# Patient Record
Sex: Female | Born: 1990 | Race: Black or African American | Hispanic: No | Marital: Single | Smoking: Never smoker
Health system: Southern US, Community
[De-identification: ages and names within clinical notes are randomized; demographics above are authoritative.]

## PROBLEM LIST (undated history)

## (undated) DIAGNOSIS — M545 Low back pain: Secondary | ICD-10-CM

## (undated) DIAGNOSIS — Z789 Other specified health status: Secondary | ICD-10-CM

## (undated) DIAGNOSIS — G8929 Other chronic pain: Secondary | ICD-10-CM

## (undated) HISTORY — PX: OTHER SURGICAL HISTORY: SHX169

## (undated) HISTORY — PX: TOOTH EXTRACTION: SUR596

## (undated) HISTORY — DX: Other chronic pain: G89.29

## (undated) HISTORY — DX: Low back pain: M54.5

---
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
## 2010-01-15 ENCOUNTER — Emergency Department: Payer: Self-pay | Admitting: Emergency Medicine

## 2010-09-04 ENCOUNTER — Observation Stay: Payer: Self-pay

## 2010-09-06 ENCOUNTER — Inpatient Hospital Stay: Payer: Self-pay

## 2011-05-06 ENCOUNTER — Emergency Department: Payer: Self-pay | Admitting: Emergency Medicine

## 2011-05-07 LAB — URINALYSIS, COMPLETE
Bacteria: NONE SEEN
Bilirubin,UR: NEGATIVE
Blood: NEGATIVE
Glucose,UR: NEGATIVE mg/dL (ref 0–75)
Nitrite: NEGATIVE
Ph: 5 (ref 4.5–8.0)
Protein: NEGATIVE
RBC,UR: 2 /HPF (ref 0–5)
Specific Gravity: 1.019 (ref 1.003–1.030)
Squamous Epithelial: 1
WBC UR: 5 /HPF (ref 0–5)

## 2011-05-07 LAB — CBC
HCT: 37.9 % (ref 35.0–47.0)
HGB: 12.8 g/dL (ref 12.0–16.0)
MCH: 31.9 pg (ref 26.0–34.0)
MCHC: 33.8 g/dL (ref 32.0–36.0)
MCV: 94 fL (ref 80–100)
Platelet: 204 10*3/uL (ref 150–440)
RBC: 4.02 10*6/uL (ref 3.80–5.20)
RDW: 12.4 % (ref 11.5–14.5)
WBC: 5.1 10*3/uL (ref 3.6–11.0)

## 2011-05-07 LAB — COMPREHENSIVE METABOLIC PANEL
Albumin: 3.9 g/dL (ref 3.4–5.0)
Alkaline Phosphatase: 54 U/L (ref 50–136)
Anion Gap: 16 (ref 7–16)
BUN: 9 mg/dL (ref 7–18)
Bilirubin,Total: 0.5 mg/dL (ref 0.2–1.0)
Calcium, Total: 8.3 mg/dL — ABNORMAL LOW (ref 8.5–10.1)
Chloride: 106 mmol/L (ref 98–107)
Co2: 20 mmol/L — ABNORMAL LOW (ref 21–32)
Creatinine: 0.73 mg/dL (ref 0.60–1.30)
EGFR (African American): >60
EGFR (Non-African Amer.): >60
Glucose: 90 mg/dL (ref 65–99)
Osmolality: 281 (ref 275–301)
Potassium: 3.5 mmol/L (ref 3.5–5.1)
SGOT(AST): 24 U/L (ref 15–37)
SGPT (ALT): 23 U/L
Sodium: 142 mmol/L (ref 136–145)
Total Protein: 7.4 g/dL (ref 6.4–8.2)

## 2011-05-07 LAB — PREGNANCY, URINE: Pregnancy Test, Urine: NEGATIVE m[IU]/mL

## 2012-05-31 ENCOUNTER — Emergency Department: Payer: Self-pay | Admitting: Emergency Medicine

## 2012-05-31 LAB — URINALYSIS, COMPLETE
Bilirubin,UR: NEGATIVE
Blood: NEGATIVE
Glucose,UR: NEGATIVE mg/dL (ref 0–75)
Hyaline Cast: 1
Ketone: NEGATIVE
Nitrite: NEGATIVE
Ph: 7 (ref 4.5–8.0)
Protein: 30
RBC,UR: 7 /HPF (ref 0–5)
Specific Gravity: 1.025 (ref 1.003–1.030)
Squamous Epithelial: 9
WBC UR: 12 /HPF (ref 0–5)

## 2012-05-31 LAB — COMPREHENSIVE METABOLIC PANEL
Albumin: 4.2 g/dL (ref 3.4–5.0)
Alkaline Phosphatase: 66 U/L (ref 50–136)
Anion Gap: 5 — ABNORMAL LOW (ref 7–16)
BUN: 11 mg/dL (ref 7–18)
Bilirubin,Total: 0.7 mg/dL (ref 0.2–1.0)
Calcium, Total: 8.5 mg/dL (ref 8.5–10.1)
Chloride: 106 mmol/L (ref 98–107)
Co2: 27 mmol/L (ref 21–32)
Creatinine: 0.76 mg/dL (ref 0.60–1.30)
EGFR (African American): >60
EGFR (Non-African Amer.): >60
Glucose: 98 mg/dL (ref 65–99)
Osmolality: 275 (ref 275–301)
Potassium: 3.6 mmol/L (ref 3.5–5.1)
SGOT(AST): 21 U/L (ref 15–37)
SGPT (ALT): 17 U/L (ref 12–78)
Sodium: 138 mmol/L (ref 136–145)
Total Protein: 7.6 g/dL (ref 6.4–8.2)

## 2012-05-31 LAB — CBC
HCT: 39.1 % (ref 35.0–47.0)
HGB: 13.6 g/dL (ref 12.0–16.0)
MCH: 32.4 pg (ref 26.0–34.0)
MCHC: 34.8 g/dL (ref 32.0–36.0)
MCV: 93 fL (ref 80–100)
Platelet: 208 10*3/uL (ref 150–440)
RBC: 4.19 10*6/uL (ref 3.80–5.20)
RDW: 12.6 % (ref 11.5–14.5)
WBC: 5.9 10*3/uL (ref 3.6–11.0)

## 2012-05-31 LAB — LIPASE, BLOOD: Lipase: 84 U/L (ref 73–393)

## 2013-03-09 ENCOUNTER — Emergency Department: Payer: Self-pay | Admitting: Emergency Medicine

## 2013-03-09 LAB — WET PREP, GENITAL

## 2013-03-09 LAB — URINALYSIS, COMPLETE
Bacteria: NONE SEEN
Bilirubin,UR: NEGATIVE
Blood: NEGATIVE
Glucose,UR: NEGATIVE mg/dL (ref 0–75)
Hyaline Cast: 1
Ketone: NEGATIVE
Leukocyte Esterase: NEGATIVE
Nitrite: NEGATIVE
Ph: 7 (ref 4.5–8.0)
Protein: NEGATIVE
RBC,UR: 5 /HPF (ref 0–5)
Specific Gravity: 1.026 (ref 1.003–1.030)
Squamous Epithelial: 3
WBC UR: 4 /HPF (ref 0–5)

## 2013-03-09 LAB — PREGNANCY, URINE: Pregnancy Test, Urine: NEGATIVE m[IU]/mL

## 2013-03-10 LAB — GC/CHLAMYDIA PROBE AMP

## 2013-11-17 ENCOUNTER — Emergency Department: Payer: Self-pay | Admitting: Student

## 2014-01-12 ENCOUNTER — Emergency Department: Payer: Self-pay | Admitting: Emergency Medicine

## 2014-01-19 ENCOUNTER — Emergency Department: Payer: Self-pay | Admitting: Emergency Medicine

## 2014-03-03 NOTE — L&D Delivery Note (Signed)
Delivery Note At 5:53 PM a viable female was delivered via Vaginal, Spontaneous Delivery (Presentation: ROA).  APGAR: 7, 9; weight TBD .   Placenta status: Intact, Spontaneous.  Cord: 3 vessels  with the following complications: tight nuchal cord x1, not able to be reduced at the perineum.  Cord pH: 7.18  Anesthesia: epidural  Episiotomy:  none Lacerations:  none Suture Repair: n/a Est. Blood Loss (mL):  300cc  Mom to postpartum.  Baby to Couplet care / Skin to Skin.  Baby was having deep variables and IUPC with amnioinfusion was placed.  Shortly after the bolus the patient was complete.  We began pushing, and after position change to patient's right side, baby was delivered.  There was a tight nuchal and baby was limp upon delivery.  Cord was cut and baby handed to nursing.  A section of cord was cut for cord gas, and blood was collected for Rh typing.  Placenta was quickly delivered spontaneously.  No lacerations.  Achille Xiang C 09/21/2014, 7:57 PM

## 2014-04-25 ENCOUNTER — Emergency Department: Payer: Self-pay | Admitting: Emergency Medicine

## 2014-09-07 LAB — OB RESULTS CONSOLE GBS: GBS: POSITIVE

## 2014-09-16 ENCOUNTER — Inpatient Hospital Stay: Admission: AD | Admit: 2014-09-16 | Payer: Self-pay | Source: Ambulatory Visit

## 2014-09-16 ENCOUNTER — Inpatient Hospital Stay: Admit: 2014-09-16 | Payer: Self-pay

## 2014-09-21 ENCOUNTER — Inpatient Hospital Stay: Payer: Medicaid Other | Admitting: Certified Registered"

## 2014-09-21 ENCOUNTER — Inpatient Hospital Stay
Admission: EM | Admit: 2014-09-21 | Discharge: 2014-09-23 | DRG: 775 | Disposition: A | Discharge status: Home / Self Care | Payer: Medicaid Other | Attending: Obstetrics & Gynecology | Admitting: Obstetrics & Gynecology

## 2014-09-21 DIAGNOSIS — Z30014 Encounter for initial prescription of intrauterine contraceptive device: Secondary | ICD-10-CM | POA: Diagnosis not present

## 2014-09-21 DIAGNOSIS — O99013 Anemia complicating pregnancy, third trimester: Secondary | ICD-10-CM | POA: Diagnosis present

## 2014-09-21 DIAGNOSIS — K219 Gastro-esophageal reflux disease without esophagitis: Secondary | ICD-10-CM | POA: Diagnosis present

## 2014-09-21 DIAGNOSIS — O480 Post-term pregnancy: Principal | ICD-10-CM | POA: Diagnosis present

## 2014-09-21 DIAGNOSIS — O3483 Maternal care for other abnormalities of pelvic organs, third trimester: Secondary | ICD-10-CM | POA: Diagnosis present

## 2014-09-21 DIAGNOSIS — O99824 Streptococcus B carrier state complicating childbirth: Secondary | ICD-10-CM | POA: Diagnosis present

## 2014-09-21 DIAGNOSIS — D62 Acute posthemorrhagic anemia: Secondary | ICD-10-CM | POA: Diagnosis not present

## 2014-09-21 DIAGNOSIS — R109 Unspecified abdominal pain: Secondary | ICD-10-CM | POA: Diagnosis present

## 2014-09-21 DIAGNOSIS — Z3A40 40 weeks gestation of pregnancy: Secondary | ICD-10-CM | POA: Diagnosis present

## 2014-09-21 DIAGNOSIS — O99213 Obesity complicating pregnancy, third trimester: Secondary | ICD-10-CM | POA: Diagnosis present

## 2014-09-21 DIAGNOSIS — N8320 Unspecified ovarian cysts: Secondary | ICD-10-CM | POA: Diagnosis present

## 2014-09-21 DIAGNOSIS — O09293 Supervision of pregnancy with other poor reproductive or obstetric history, third trimester: Secondary | ICD-10-CM | POA: Diagnosis not present

## 2014-09-21 HISTORY — DX: Other specified health status: Z78.9

## 2014-09-21 LAB — CBC
HCT: 33.1 % — ABNORMAL LOW (ref 35.0–47.0)
Hemoglobin: 11.1 g/dL — ABNORMAL LOW (ref 12.0–16.0)
MCH: 30.4 pg (ref 26.0–34.0)
MCHC: 33.6 g/dL (ref 32.0–36.0)
MCV: 90.6 fL (ref 80.0–100.0)
Platelets: 171 10*3/uL (ref 150–440)
RBC: 3.66 MIL/uL — ABNORMAL LOW (ref 3.80–5.20)
RDW: 14.1 % (ref 11.5–14.5)
WBC: 8.4 10*3/uL (ref 3.6–11.0)

## 2014-09-21 LAB — TYPE AND SCREEN
ABO/RH(D): O POS
Antibody Screen: NEGATIVE

## 2014-09-21 LAB — OB RESULTS CONSOLE VARICELLA ZOSTER ANTIBODY, IGG: Varicella: IMMUNE

## 2014-09-21 LAB — OB RESULTS CONSOLE RUBELLA ANTIBODY, IGM: Rubella: IMMUNE

## 2014-09-21 LAB — ABO/RH: ABO/RH(D): O POS

## 2014-09-21 MED ORDER — IBUPROFEN 600 MG PO TABS
ORAL_TABLET | ORAL | Status: AC
  Administered 2014-09-21: 600 mg via ORAL
  Filled 2014-09-21: qty 1

## 2014-09-21 MED ORDER — SODIUM CHLORIDE 0.9 % IV SOLN
2.0000 g | Freq: Once | INTRAVENOUS | Status: AC
  Administered 2014-09-21: 2 g via INTRAVENOUS
  Filled 2014-09-21: qty 2000

## 2014-09-21 MED ORDER — SODIUM CHLORIDE 0.9 % IJ SOLN
3.0000 mL | INTRAMUSCULAR | Status: DC | PRN
Start: 2014-09-21 — End: 2014-09-21

## 2014-09-21 MED ORDER — NALOXONE HCL 1 MG/ML IJ SOLN: 1.0000 ug/kg/h | INTRAVENOUS | Status: DC | PRN

## 2014-09-21 MED ORDER — MISOPROSTOL 200 MCG PO TABS
800.0000 ug | ORAL_TABLET | Freq: Once | ORAL | Status: DC | PRN
  Filled 2014-09-21: qty 4

## 2014-09-21 MED ORDER — MISOPROSTOL 200 MCG PO TABS
ORAL_TABLET | ORAL | Status: AC
  Filled 2014-09-21: qty 1

## 2014-09-21 MED ORDER — BUPIVACAINE HCL (PF) 0.25 % IJ SOLN
INTRAMUSCULAR | Status: DC | PRN
  Administered 2014-09-21: 5 mL

## 2014-09-21 MED ORDER — ONDANSETRON HCL 4 MG PO TABS: 4.0000 mg | ORAL_TABLET | ORAL | Status: DC | PRN

## 2014-09-21 MED ORDER — LACTATED RINGERS IV SOLN: 500.0000 mL | INTRAVENOUS | Status: DC | PRN

## 2014-09-21 MED ORDER — IBUPROFEN 600 MG PO TABS
600.0000 mg | ORAL_TABLET | Freq: Four times a day (QID) | ORAL | Status: DC
  Administered 2014-09-21 – 2014-09-23 (×6): 600 mg via ORAL
  Filled 2014-09-21 (×5): qty 1

## 2014-09-21 MED ORDER — ONDANSETRON HCL 4 MG/2ML IJ SOLN: 4.0000 mg | Freq: Four times a day (QID) | INTRAMUSCULAR | Status: DC | PRN

## 2014-09-21 MED ORDER — NALBUPHINE HCL 10 MG/ML IJ SOLN
5.0000 mg | Freq: Once | INTRAMUSCULAR | Status: DC | PRN
  Filled 2014-09-21: qty 0.5

## 2014-09-21 MED ORDER — FENTANYL 2.5 MCG/ML W/ROPIVACAINE 0.2% IN NS 100 ML EPIDURAL INFUSION (ARMC-ANES)
EPIDURAL | Status: AC
  Administered 2014-09-21: 9 mL/h via EPIDURAL
  Filled 2014-09-21: qty 100

## 2014-09-21 MED ORDER — DIPHENHYDRAMINE HCL 50 MG/ML IJ SOLN: 12.5000 mg | INTRAMUSCULAR | Status: DC | PRN

## 2014-09-21 MED ORDER — OXYTOCIN BOLUS FROM INFUSION
500.0000 mL | INTRAVENOUS | Status: DC
Start: 2014-09-21 — End: 2014-09-21

## 2014-09-21 MED ORDER — SCOPOLAMINE 1 MG/3DAYS TD PT72
1.0000 | MEDICATED_PATCH | Freq: Once | TRANSDERMAL | Status: DC
  Filled 2014-09-21: qty 1

## 2014-09-21 MED ORDER — SODIUM CHLORIDE 0.9 % IV SOLN
1.0000 g | INTRAVENOUS | Status: DC
  Administered 2014-09-21 (×2): 1 g via INTRAVENOUS
  Filled 2014-09-21 (×6): qty 1000

## 2014-09-21 MED ORDER — ONDANSETRON HCL 4 MG/2ML IJ SOLN: 4.0000 mg | INTRAMUSCULAR | Status: DC | PRN

## 2014-09-21 MED ORDER — NALBUPHINE HCL 10 MG/ML IJ SOLN
5.0000 mg | INTRAMUSCULAR | Status: DC | PRN
  Filled 2014-09-21: qty 0.5

## 2014-09-21 MED ORDER — BENZOCAINE-MENTHOL 20-0.5 % EX AERO: 1.0000 "application " | INHALATION_SPRAY | CUTANEOUS | Status: DC | PRN

## 2014-09-21 MED ORDER — LANOLIN HYDROUS EX OINT: TOPICAL_OINTMENT | CUTANEOUS | Status: DC | PRN

## 2014-09-21 MED ORDER — ONDANSETRON HCL 4 MG/2ML IJ SOLN
4.0000 mg | Freq: Three times a day (TID) | INTRAMUSCULAR | Status: DC | PRN
Start: 2014-09-21 — End: 2014-09-21

## 2014-09-21 MED ORDER — DIPHENHYDRAMINE HCL 25 MG PO CAPS: 25.0000 mg | ORAL_CAPSULE | Freq: Four times a day (QID) | ORAL | Status: DC | PRN

## 2014-09-21 MED ORDER — NALOXONE HCL 0.4 MG/ML IJ SOLN: 0.4000 mg | INTRAMUSCULAR | Status: DC | PRN

## 2014-09-21 MED ORDER — LIDOCAINE-EPINEPHRINE (PF) 1.5 %-1:200000 IJ SOLN
INTRAMUSCULAR | Status: DC | PRN
  Administered 2014-09-21: 3 mL via EPIDURAL

## 2014-09-21 MED ORDER — AMMONIA AROMATIC IN INHA
0.3000 mL | Freq: Once | RESPIRATORY_TRACT | Status: AC | PRN
  Filled 2014-09-21: qty 10

## 2014-09-21 MED ORDER — WITCH HAZEL-GLYCERIN EX PADS: 1.0000 "application " | MEDICATED_PAD | CUTANEOUS | Status: DC | PRN

## 2014-09-21 MED ORDER — SIMETHICONE 80 MG PO CHEW: 80.0000 mg | CHEWABLE_TABLET | ORAL | Status: DC | PRN

## 2014-09-21 MED ORDER — DOCUSATE SODIUM 100 MG PO CAPS
100.0000 mg | ORAL_CAPSULE | Freq: Two times a day (BID) | ORAL | Status: DC
  Administered 2014-09-22 – 2014-09-23 (×3): 100 mg via ORAL
  Filled 2014-09-21 (×3): qty 1

## 2014-09-21 MED ORDER — LACTATED RINGERS IV SOLN
INTRAVENOUS | Status: DC
  Administered 2014-09-21: 999 mL/h via INTRAUTERINE

## 2014-09-21 MED ORDER — DIPHENHYDRAMINE HCL 25 MG PO CAPS
25.0000 mg | ORAL_CAPSULE | ORAL | Status: DC | PRN
Start: 2014-09-21 — End: 2014-09-21

## 2014-09-21 MED ORDER — PRENATAL MULTIVITAMIN CH
1.0000 | ORAL_TABLET | Freq: Every day | ORAL | Status: DC
  Administered 2014-09-22 – 2014-09-23 (×2): 1 via ORAL
  Filled 2014-09-21 (×2): qty 1

## 2014-09-21 MED ORDER — OXYTOCIN 40 UNITS IN LACTATED RINGERS INFUSION - SIMPLE MED
62.5000 mL/h | INTRAVENOUS | Status: DC
  Administered 2014-09-21: 3 mL/h via INTRAVENOUS
  Filled 2014-09-21: qty 1000

## 2014-09-21 MED ORDER — LACTATED RINGERS IV SOLN
INTRAVENOUS | Status: DC
  Administered 2014-09-21: 125 mL/h via INTRAVENOUS
  Administered 2014-09-21 (×2): via INTRAVENOUS

## 2014-09-21 MED ORDER — LIDOCAINE HCL (PF) 1 % IJ SOLN
30.0000 mL | INTRAMUSCULAR | Status: DC | PRN
  Filled 2014-09-21: qty 30

## 2014-09-21 MED ORDER — DIBUCAINE 1 % RE OINT: 1.0000 "application " | TOPICAL_OINTMENT | RECTAL | Status: DC | PRN

## 2014-09-21 MED ORDER — FENTANYL 2.5 MCG/ML W/ROPIVACAINE 0.2% IN NS 100 ML EPIDURAL INFUSION (ARMC-ANES): 9.0000 mL/h | EPIDURAL | Status: DC

## 2014-09-21 MED ORDER — ACETAMINOPHEN 325 MG PO TABS
650.0000 mg | ORAL_TABLET | ORAL | Status: DC | PRN
  Administered 2014-09-22 (×2): 650 mg via ORAL
  Filled 2014-09-21 (×2): qty 2

## 2014-09-21 NOTE — Anesthesia Preprocedure Evaluation (Addendum)
Anesthesia Evaluation  Patient identified by MRN, date of birth, ID band Patient awake    Reviewed: Allergy & Precautions, H&P , NPO status , Patient's Chart, lab work & pertinent test results  History of Anesthesia Complications Negative for: history of anesthetic complications  Airway Mallampati: I  TM Distance: >3 FB Neck ROM: full    Dental no notable dental hx. (+) Teeth Intact   Pulmonary neg pulmonary ROS,    Pulmonary exam normal       Cardiovascular negative cardio ROS Normal cardiovascular exam    Neuro/Psych negative neurological ROS  negative psych ROS   GI/Hepatic Neg liver ROS, GERD-  ,  Endo/Other  negative endocrine ROS  Renal/GU negative Renal ROS  negative genitourinary   Musculoskeletal   Abdominal   Peds  Hematology negative hematology ROS (+)   Anesthesia Other Findings Tongue piercing  Reproductive/Obstetrics (+) Pregnancy                             Anesthesia Physical Anesthesia Plan  ASA: II  Anesthesia Plan: Epidural   Post-op Pain Management:    Induction:   Airway Management Planned:   Additional Equipment:   Intra-op Plan:   Post-operative Plan:   Informed Consent: I have reviewed the patients History and Physical, chart, labs and discussed the procedure including the risks, benefits and alternatives for the proposed anesthesia with the patient or authorized representative who has indicated his/her understanding and acceptance.     Plan Discussed with: CRNA  Anesthesia Plan Comments:         Anesthesia Quick Evaluation

## 2014-09-21 NOTE — OB Triage Note (Signed)
Patient c/o contractions since 0330 this am 5-10 mins apart

## 2014-09-21 NOTE — Progress Notes (Signed)
In to see pt d/t prolonged FHR decel down to 60s, BP during episode down to 80s/40s. FHR decel resolved with O2, IV fluid bolus and repositioning.  Cervix 6/100/-2, BBOW Will plan to recheck shortly and consider AROM.

## 2014-09-21 NOTE — H&P (Signed)
H&P Note   Date of Initial H&P:18 September 2014 History reviewed, patient examined, no changes except as listed below.  24 year old G2 P1001 with EDC=09/17/2014 by LMP=8 week ultrasound presents at 40 4/7 weeks to L&D in labor. She reports onset of contractions at 0350 this AM.She was scheduled for an IOL 09/24/2014.  Pregnancy c/b BMI 35, 5 cm cyst, GBS positive.  EXAM: BP 116/55 mmHg  Pulse 65  Temp(Src) 98.3 F (36.8 C) (Oral)  Resp 20  LMP 12/11/2013 (Exact Date)  FHR: shortly after arrival to unit there were three late deep decelerations to the 60s , followed by a run of subtle late decelerations. Currently on her side FHR baseline 120s to 130 with accelerations and moderate variability.  Contractions q2-3 minutes apart Cervix on arrival 2-3/80%, now 3-4/50%/-2/vtx Ultrasound: vertex. AFI 9.7 cm  LABS: OPOS/ RI/VI/  RPR neg/HIV negative/ Hept B negative/ TDAP UTD/ Pap ascus with pos HPV  A/P IUP at 40.4 weeks in early labor Late decelerations on arrival-now resolved-monitor FHR closely GBS positive: PPX started RO cyst: can follow up pp  Farrel Conners, CNM

## 2014-09-21 NOTE — Discharge Summary (Signed)
Obstetrical Discharge Summary  Date of Admission: 09/21/2014 Date of Discharge: 09/21/2014  Primary OB: [facility]  Gestational Age at Delivery: [redacted]w[redacted]d   Antepartum complications: obesity, ovarian cyst, anemia, GBS+ Reason for Admission: labor Date of Delivery:  09/21/14 Delivered By: Leeroy Bock Ward Delivery Type: spontaneous vaginal delivery Intrapartum complications/course: Patient was admitted in labor and fetal strip showed recurrent variable decelerations. IUPC was inserted and amnioinfusion started but patient quickly progressed from 6 to 10 and due to fetal strip we began pushing.  Fetal head was high above the pubic symphysis, with deeper and longer variables to the 60s.  With change of position and patient pushing on her side, the fetal head rotated and then advanced well with great maternal effort.  Baby was delivered with a tight nuchal cord that was unable to be reduced at the perineum.  Baby was not vigorous immediately, so was handed off to the pediatric nursing staff after cord was cut.  Cord gas was collected and 7.18, and apgars were 7 and 9.  Placenta was delivered spontaneously and perineum was intact.   Anesthesia: epidural Placenta: sponatneous Laceration: none Episiotomy: none Newborn Data: Live born female  Birth Weight:   APGAR: 7, 9    Discharge Physical Exam:  General: NAD CV: RRR Pulm: CTABL, nl effort ABD: s/nd/nt, fundus firm and below the umbilicus Lochia: moderate DVT Evaluation: LE non-ttp, no evidence of DVT on exam.  HEMOGLOBIN  Date Value Ref Range Status  09/21/2014 11.1* 12.0 - 16.0 g/dL Final   HGB  Date Value Ref Range Status  05/31/2012 13.6 12.0-16.0 g/dL Final   HCT  Date Value Ref Range Status  09/21/2014 33.1* 35.0 - 47.0 % Final  05/31/2012 39.1 35.0-47.0 % Final    Post partum course: none Postpartum Procedures: none Disposition: stable, discharge to home.  Rh Immune globulin given: no Rubella vaccine given: no Tdap vaccine  given in AP or PP setting: yes, antepartum Flu vaccine given in AP or PP setting: no  Contraception: Mirena  Prenatal Labs:  OPOS/ RI/VI/ RPR neg/HIV negative/ Hept B negative/ TDAP UTD/ Pap ascus with pos HPV   Plan:  [place] D Menta was discharged to home in good condition. Follow-up appointment at [facility] with Dr Elesa Massed in 6 weeks   Discharge Medications:   Medication List    ASK your doctor about these medications        prenatal multivitamin Tabs tablet  Take 1 tablet by mouth daily at 12 noon.        Signed: Vena Austria, MD

## 2014-09-21 NOTE — Anesthesia Procedure Notes (Signed)
Epidural Patient location during procedure: OB Start time: 09/21/2014 11:50 AM End time: 09/21/2014 12:02 PM  Staffing Anesthesiologist: Rosaria Ferries Resident/CRNA: Mathews Argyle Performed by: resident/CRNA   Preanesthetic Checklist Completed: patient identified, site marked, surgical consent, pre-op evaluation, timeout performed, IV checked, risks and benefits discussed and monitors and equipment checked  Epidural Patient position: sitting Prep: Betadine and site prepped and draped Patient monitoring: heart rate, continuous pulse ox and blood pressure Approach: midline Location: L3-L4 Injection technique: LOR saline  Needle:  Needle type: Tuohy  Needle gauge: 18 G Needle length: 9 cm Needle insertion depth: 8 cm Catheter type: closed end flexible Catheter size: 20 Guage Catheter at skin depth: 13 cm Test dose: negative and 1.5% lidocaine with Epi 1:200 K  Assessment Events: blood not aspirated, injection not painful, no injection resistance, negative IV test and no paresthesia  Additional Notes   Patient tolerated the insertion well without complications.Reason for block:procedure for pain

## 2014-09-21 NOTE — Progress Notes (Signed)
L&D Note  09/21/2014 - 10:51 AM  24 y.o. G2P1001 [redacted]w[redacted]d   Ms. Mariette D Carrell is admitted for labor and FHR decelerations   Subjective:  Questionable leaking fluid  Objective:   Filed Vitals:   09/21/14 0550 09/21/14 0801 09/21/14 1029  BP: 128/63 116/55   Pulse: 67 65   Temp:  98.3 F (36.8 C) 98.5 F (36.9 C)  TempSrc:  Oral Oral  Resp:  20     Current Vital Signs 24h Vital Sign Ranges  T 98.5 F (36.9 C) Temp  Avg: 98.4 F (36.9 C)  Min: 98.3 F (36.8 C)  Max: 98.5 F (36.9 C)  BP (!) 116/55 mmHg BP  Min: 116/55  Max: 128/63  HR 65 Pulse  Avg: 66  Min: 65  Max: 67  RR 20 Resp  Avg: 20  Min: 20  Max: 20  SaO2     No Data Recorded       24 Hour I/O Current Shift I/O  Time Ins Outs        FHR: baseline 130, mod variability, + accels, intermittent variable  Toco: q 2-4 min SVE: 6/90/-2, ballotable, membranes intact   Assessment :  IUP at term, labor    Plan:  Pt requesting epidural   Marta Antu, CNM

## 2014-09-22 LAB — CBC
HCT: 29.3 % — ABNORMAL LOW (ref 35.0–47.0)
Hemoglobin: 9.7 g/dL — ABNORMAL LOW (ref 12.0–16.0)
MCH: 30.1 pg (ref 26.0–34.0)
MCHC: 33.1 g/dL (ref 32.0–36.0)
MCV: 91 fL (ref 80.0–100.0)
Platelets: 156 10*3/uL (ref 150–440)
RBC: 3.22 MIL/uL — ABNORMAL LOW (ref 3.80–5.20)
RDW: 13.9 % (ref 11.5–14.5)
WBC: 11 10*3/uL (ref 3.6–11.0)

## 2014-09-22 LAB — RPR: RPR Ser Ql: NONREACTIVE

## 2014-09-22 NOTE — Anesthesia Postprocedure Evaluation (Signed)
  Anesthesia Post-op Note  Patient: Abigail Mejia  Procedure(s) Performed: labor epidural  Anesthesia type:epidural  Patient location: 346  Post pain: Pain level controlled  Post assessment: Post-op Vital signs reviewed, Patient's Cardiovascular Status Stable, Respiratory Function Stable, Patent Airway and No signs of Nausea or vomiting  Post vital signs: Reviewed and stable  Last Vitals:  Filed Vitals:   09/22/14 1313  BP: 119/66  Pulse: 78  Temp: 36.7 C  Resp: 18    Level of consciousness: awake, alert  and patient cooperative  Complications: No apparent anesthesia complications

## 2014-09-22 NOTE — Progress Notes (Signed)
Patient ID: Abigail Mejia, female   DOB: 11/17/1990, 24 y.o.   MRN: [id number] Obstetric Postpartum Daily Progress Note Subjective:  24 y.o. G2P2001 status post vaginal delivery.  She is ambulating, is tolerating po, is voiding spontaneously.  Her pain is well controlled on PO pain medications. Her lochia is equal to menses.   Medications SCHEDULED MEDICATIONS  . docusate sodium  100 mg Oral BID  . ibuprofen  600 mg Oral 4 times per day  . prenatal multivitamin  1 tablet Oral Q1200    MEDICATION INFUSIONS  . oxytocin 40 units in LR 1000 mL Stopped (09/21/14 1716)    PRN MEDICATIONS  acetaminophen, benzocaine-Menthol, witch hazel-glycerin **AND** dibucaine, diphenhydrAMINE, lanolin, ondansetron **OR** ondansetron (ZOFRAN) IV, simethicone    Objective:   Filed Vitals:   09/22/14 0401 09/22/14 0706 09/22/14 0730 09/22/14 1313  BP: 107/56 102/78 115/59 119/66  Pulse: 58 61 67 78  Temp: 97.6 F (36.4 C) 98 F (36.7 C) 97.8 F (36.6 C) 98 F (36.7 C)  TempSrc: Oral Oral Oral Oral  Resp: SpO2:   100%     Current Vital Signs 24h Vital Sign Ranges  T 98 F (36.7 C) Temp  Avg: 97.8 F (36.6 C)  Min: 97.6 F (36.4 C)  Max: 98 F (36.7 C)  BP 119/66 mmHg BP  Min: 101/64  Max: 136/69  HR 78 Pulse  Avg: 84.2  Min: 58  Max: 265  RR 18 Resp  Avg: 18.7  Min: 18  Max: 20  SaO2 100 % Not Delivered SpO2  Avg: 100 %  Min: 100 %  Max: 100 %       24 Hour I/O Current Shift I/O  Time Ins Outs 07/21 0701 - 07/22 0700 In: 485 [P.O.:360; I.V.:125] Out: 500 [Urine:500]    General: NAD Pulmonary: no increased work of breathing Abdomen: non-distended, non-tender, fundus firm at level of umbilicus Extremities: no edema, no erythema, no tenderness  Labs:   Recent Labs Lab 09/21/14 0725 09/22/14 0542  WBC 8.4 11.0  HGB 11.1* 9.7*  HCT 33.1* 29.3*  PLT 171 156     Assessment:   24 y.o. G2P2001 postpartum day # 1, doing well  Plan:    1) Acute blood loss anemia -  hemodynamically stable and asymptomatic - po ferrous sulfate  2) O POS / Rubella Immune (07/21 0000)/ Varicella Immune  3) TDAP status up to date (given 07/13/14)  4) breast /Contraception = IUD  5) Disposition: home tomorrow  Conard Novak, MD, FACOG 09/22/2014 2:27 PM

## 2014-09-23 LAB — CHLAMYDIA/NGC RT PCR (ARMC ONLY)
Chlamydia Tr: NOT DETECTED
N gonorrhoeae: NOT DETECTED

## 2014-09-23 MED ORDER — IBUPROFEN 600 MG PO TABS: 600.0000 mg | ORAL_TABLET | Freq: Four times a day (QID) | ORAL | Status: DC

## 2014-09-23 NOTE — Progress Notes (Signed)
Pt discharged at this time via wheelchair escorted by RN; pt going home with her mom, grandmom and new baby

## 2014-09-23 NOTE — Progress Notes (Signed)
RN noticed that GC/Chly urine results not back; noticed that an order for that urine sample never entered; RN asked L&D nurse "pt is 23, she had urine GC/Chly done prenatally, but we still get one on her based on age correct?"; L&D nurse, "that's correct"; RN contacted dr. Bonney Aid to notify him that pt needed order for urine GC/Chly prior to discharge; order entered; pt being discharged later today; specimen collected prior to discharge and sent to lab

## 2014-09-23 NOTE — Discharge Instructions (Signed)

## 2014-11-16 LAB — HM PAP SMEAR: HM Pap smear: NEGATIVE

## 2015-04-20 ENCOUNTER — Emergency Department
Admission: EM | Admit: 2015-04-20 | Discharge: 2015-04-20 | Disposition: A | Discharge status: Home / Self Care | Payer: Medicaid Other | Attending: Emergency Medicine | Admitting: Emergency Medicine

## 2015-04-20 ENCOUNTER — Encounter: Payer: Self-pay | Admitting: Emergency Medicine

## 2015-04-20 DIAGNOSIS — B349 Viral infection, unspecified: Secondary | ICD-10-CM | POA: Insufficient documentation

## 2015-04-20 DIAGNOSIS — Z79899 Other long term (current) drug therapy: Secondary | ICD-10-CM | POA: Insufficient documentation

## 2015-04-20 LAB — RAPID INFLUENZA A&B ANTIGENS
Influenza A (ARMC): NOT DETECTED
Influenza B (ARMC): NOT DETECTED

## 2015-04-20 MED ORDER — ONDANSETRON 4 MG PO TBDP: 4.0000 mg | ORAL_TABLET | Freq: Three times a day (TID) | ORAL | Status: DC | PRN

## 2015-04-20 MED ORDER — CETIRIZINE HCL 10 MG PO TABS: 10.0000 mg | ORAL_TABLET | Freq: Every day | ORAL | Status: DC

## 2015-04-20 MED ORDER — IBUPROFEN 800 MG PO TABS
800.0000 mg | ORAL_TABLET | Freq: Once | ORAL | Status: AC
  Administered 2015-04-20: 800 mg via ORAL
  Filled 2015-04-20: qty 1

## 2015-04-20 MED ORDER — ACETAMINOPHEN 500 MG PO TABS
ORAL_TABLET | ORAL | Status: AC
  Filled 2015-04-20: qty 2

## 2015-04-20 MED ORDER — FLUTICASONE PROPIONATE 50 MCG/ACT NA SUSP: 1.0000 | Freq: Two times a day (BID) | NASAL | Status: DC

## 2015-04-20 MED ORDER — PSEUDOEPH-BROMPHEN-DM 30-2-10 MG/5ML PO SYRP: 10.0000 mL | ORAL_SOLUTION | Freq: Four times a day (QID) | ORAL | Status: DC | PRN

## 2015-04-20 NOTE — ED Provider Notes (Signed)
[facility] Emergency Department Provider Note  ____________________________________________  Time seen: Approximately 7:59 AM  I have reviewed the triage vital signs and the nursing notes.   HISTORY  Chief Complaint Fever    HPI Abigail Mejia is a 25 y.o. female who presents to emergency department complaining of sudden onset of fevers, chills, nausea, vomiting, myalgias and arthralgias, nasal congestion, sore throat. Patient states that she has had Tylenol this morning prior to arrival.   Past Medical History  Diagnosis Date  . Medical history non-contributory     Patient Active Problem List   Diagnosis Date Noted  . Abdominal pain 09/21/2014  . Indication for care in labor or delivery 09/21/2014    History reviewed. No pertinent past surgical history.  Current Outpatient Rx  Name  Route  Sig  Dispense  Refill  . brompheniramine-pseudoephedrine-DM 30-2-10 MG/5ML syrup   Oral   Take 10 mLs by mouth 4 (four) times daily as needed.   200 mL   0   . cetirizine (ZYRTEC) 10 MG tablet   Oral   Take 1 tablet (10 mg total) by mouth daily.   30 tablet   0   . fluticasone (FLONASE) 50 MCG/ACT nasal spray   Each Nare   Place 1 spray into both nostrils 2 (two) times daily.   16 g   0   . ibuprofen (ADVIL,MOTRIN) 600 MG tablet   Oral   Take 1 tablet (600 mg total) by mouth every 6 (six) hours.   30 tablet   0   . Iron-FA-B Cmp-C-Biot-Probiotic (FUSION PLUS) CAPS   Oral   Take 1 capsule by mouth daily.      6   . ondansetron (ZOFRAN-ODT) 4 MG disintegrating tablet   Oral   Take 1 tablet (4 mg total) by mouth every 8 (eight) hours as needed for nausea or vomiting.   20 tablet   0   . Prenatal Vit-Fe Fumarate-FA (PRENATAL MULTIVITAMIN) TABS tablet   Oral   Take 1 tablet by mouth daily at 12 noon.           Allergies Review of patient's allergies indicates no known allergies.  History reviewed. No pertinent family  history.  Social History Social History  Substance Use Topics  . Smoking status: Never Smoker   . Smokeless tobacco: Never Used  . Alcohol Use: No     Review of Systems  Constitutional: Positive fever/chills Eyes: No visual changes. No discharge ENT: Positive sore throat. Positive nasal congestion.  Cardiovascular: no chest pain. Respiratory: no cough. No SOB. Gastrointestinal: No abdominal pain. Positive for nausea and vomiting.  No diarrhea.  No constipation. Genitourinary: Negative for dysuria. No hematuria Musculoskeletal: Negative for back pain. Positive for myalgias. Skin: Negative for rash. Neurological: Negative for headaches, focal weakness or numbness. 10-point ROS otherwise negative.  ____________________________________________   PHYSICAL EXAM:  VITAL SIGNS: ED Triage Vitals  Enc Vitals Group     BP 04/20/15 0744 126/64 mmHg     Pulse Rate 04/20/15 0744 99     Resp 04/20/15 0744 18     Temp 04/20/15 0744 103.2 F (39.6 C)     Temp Source 04/20/15 0744 Oral     SpO2 04/20/15 0744 98 %     Weight 04/20/15 0744 200 lb (90.719 kg)     Height 04/20/15 0744  (1.626 m)     Head Cir --      Peak Flow --  Pain Score 04/20/15 0745 9     Pain Loc --      Pain Edu? --      Excl. in GC? --      Constitutional: Alert and oriented. Well appearing and in no acute distress. Eyes: Conjunctivae are normal. PERRL. EOMI. Head: Atraumatic. ENT:      Ears: EACs and TMs are unremarkable bilaterally.      Nose: Mild clear congestion/rhinnorhea.      Mouth/Throat: Mucous membranes are moist. Oropharynx is erythematous but nonedematous. Uvula is midline. Tonsils are nonedematous and nonerythematous. Neck: No stridor.   Hematological/Lymphatic/Immunilogical: Diffuse, mobile, non-tender cervical lymphadenopathy. Cardiovascular: Normal rate, regular rhythm. Normal S1 and S2.  Good peripheral circulation. Respiratory: Normal respiratory effort without tachypnea or  retractions. Lungs CTAB. Neurologic:  Normal speech and language. No gross focal neurologic deficits are appreciated.  Skin:  Skin is warm, dry and intact. No rash noted. Psychiatric: Mood and affect are normal. Speech and behavior are normal. Patient exhibits appropriate insight and judgement.   ____________________________________________   LABS (all labs ordered are listed, but only abnormal results are displayed)  Labs Reviewed  RAPID INFLUENZA A&B ANTIGENS ([facility] ONLY)   ____________________________________________  EKG   ____________________________________________  RADIOLOGY   No results found.  ____________________________________________    PROCEDURES  Procedure(s) performed:       Medications  acetaminophen (TYLENOL) 500 MG tablet (not administered)  ibuprofen (ADVIL,MOTRIN) tablet 800 mg (800 mg Oral Given 04/20/15 0806)     ____________________________________________   INITIAL IMPRESSION / ASSESSMENT AND PLAN / ED COURSE  Pertinent labs & imaging results that were available during my care of the patient were reviewed by me and considered in my medical decision making (see chart for details).  Patient's diagnosis is consistent with viral infection. Patient's. Ischemic negative for both influenza A and B. Patient will be given medications for symptom relief of nasal congestion, sore throat, and nausea. Patient is to follow-up with emergency department for any sudden worsening or increase of symptoms. Otherwise patient may follow up with primary care provider..     ____________________________________________  FINAL CLINICAL IMPRESSION(S) / ED DIAGNOSES  Final diagnoses:  Viral illness      NEW MEDICATIONS STARTED DURING THIS VISIT:  New Prescriptions   BROMPHENIRAMINE-PSEUDOEPHEDRINE-DM 30-2-10 MG/5ML SYRUP    Take 10 mLs by mouth 4 (four) times daily as needed.   CETIRIZINE (ZYRTEC) 10 MG TABLET    Take 1 tablet (10 mg total) by mouth  daily.   FLUTICASONE (FLONASE) 50 MCG/ACT NASAL SPRAY    Place 1 spray into both nostrils 2 (two) times daily.   ONDANSETRON (ZOFRAN-ODT) 4 MG DISINTEGRATING TABLET    Take 1 tablet (4 mg total) by mouth every 8 (eight) hours as needed for nausea or vomiting.        Delorise Royals Brantley Wiley, PA-C 04/20/15 1610  Jennye Moccasin, MD 04/20/15 1016

## 2015-04-20 NOTE — ED Notes (Signed)
States she developed fever with n/v yesterday  Body aches

## 2015-04-20 NOTE — ED Notes (Signed)
Bodyaches.  NAD

## 2015-04-20 NOTE — Discharge Instructions (Signed)
Viral Infections °A viral infection can be caused by different types of viruses. Most viral infections are not serious and resolve on their own. However, some infections may cause severe symptoms and may lead to further complications. °SYMPTOMS °Viruses can frequently cause: °· Minor sore throat. °· Aches and pains. °· Headaches. °· Runny nose. °· Different types of rashes. °· Watery eyes. °· Tiredness. °· Cough. °· Loss of appetite. °· Gastrointestinal infections, resulting in nausea, vomiting, and diarrhea. °These symptoms do not respond to antibiotics because the infection is not caused by bacteria. However, you might catch a bacterial infection following the viral infection. This is sometimes called a "superinfection." Symptoms of such a bacterial infection may include: °· Worsening sore throat with pus and difficulty swallowing. °· Swollen neck glands. °· Chills and a high or persistent fever. °· Severe headache. °· Tenderness over the sinuses. °· Persistent overall ill feeling (malaise), muscle aches, and tiredness (fatigue). °· Persistent cough. °· Yellow, green, or brown mucus production with coughing. °HOME CARE INSTRUCTIONS  °· Only take over-the-counter or prescription medicines for pain, discomfort, diarrhea, or fever as directed by your caregiver. °· Drink enough water and fluids to keep your urine clear or pale yellow. Sports drinks can provide valuable electrolytes, sugars, and hydration. °· Get plenty of rest and maintain proper nutrition. Soups and broths with crackers or rice are fine. °SEEK IMMEDIATE MEDICAL CARE IF:  °· You have severe headaches, shortness of breath, chest pain, neck pain, or an unusual rash. °· You have uncontrolled vomiting, diarrhea, or you are unable to keep down fluids. °· You or your child has an oral temperature above 102° F (38.9° C), not controlled by medicine. °· Your baby is older than 3 months with a rectal temperature of 102° F (38.9° C) or higher. °· Your baby is 3  months old or younger with a rectal temperature of 100.4° F (38° C) or higher. °MAKE SURE YOU:  °· Understand these instructions. °· Will watch your condition. °· Will get help right away if you are not doing well or get worse. °  °This information is not intended to replace advice given to you by your health care provider. Make sure you discuss any questions you have with your health care provider. °  °Document Released: 11/27/2004 Document Revised: 05/12/2011 Document Reviewed: 07/26/2014 °Elsevier Interactive Patient Education ©2016 [employer] ° °

## 2015-10-26 DIAGNOSIS — M545 Low back pain: Secondary | ICD-10-CM | POA: Diagnosis present

## 2015-10-26 DIAGNOSIS — Z79899 Other long term (current) drug therapy: Secondary | ICD-10-CM | POA: Diagnosis not present

## 2015-10-26 NOTE — ED Triage Notes (Signed)
Pt ambulatory to triage with no difficulty. Pt \\reports  pain across her lower back for 3 to 4 days. 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

## 2015-10-27 ENCOUNTER — Emergency Department
Admission: EM | Admit: 2015-10-27 | Discharge: 2015-10-27 | Disposition: A | Discharge status: Home / Self Care | Payer: Medicaid Other | Attending: Emergency Medicine | Admitting: Emergency Medicine

## 2015-12-21 DIAGNOSIS — M545 Low back pain, unspecified: Secondary | ICD-10-CM

## 2015-12-21 DIAGNOSIS — G8929 Other chronic pain: Secondary | ICD-10-CM

## 2015-12-21 HISTORY — DX: Low back pain, unspecified: M54.50

## 2015-12-21 HISTORY — DX: Other chronic pain: G89.29

## 2016-10-09 ENCOUNTER — Emergency Department
Admission: EM | Admit: 2016-10-09 | Discharge: 2016-10-10 | Disposition: A | Discharge status: Home / Self Care | Payer: Medicaid Other | Attending: Emergency Medicine | Admitting: Emergency Medicine

## 2016-10-09 ENCOUNTER — Encounter: Payer: Self-pay | Admitting: *Deleted

## 2016-10-09 DIAGNOSIS — R0981 Nasal congestion: Secondary | ICD-10-CM | POA: Diagnosis present

## 2016-10-09 DIAGNOSIS — R05 Cough: Secondary | ICD-10-CM | POA: Insufficient documentation

## 2016-10-09 DIAGNOSIS — J0100 Acute maxillary sinusitis, unspecified: Secondary | ICD-10-CM

## 2016-10-09 DIAGNOSIS — J0101 Acute recurrent maxillary sinusitis: Secondary | ICD-10-CM | POA: Insufficient documentation

## 2016-10-09 NOTE — ED Triage Notes (Signed)
Pt has sinus congestion for 2 weeks    Taking claritin without relief.  Non smoker.  Pt also has a nonproductive cough. Pt alert.

## 2016-10-10 MED ORDER — AZITHROMYCIN 500 MG PO TABS
500.0000 mg | ORAL_TABLET | Freq: Once | ORAL | Status: AC
  Administered 2016-10-10: 500 mg via ORAL
  Filled 2016-10-10: qty 1

## 2016-10-10 MED ORDER — AZITHROMYCIN 250 MG PO TABS: ORAL_TABLET | ORAL | 0 refills | Status: AC

## 2016-10-10 NOTE — ED Provider Notes (Signed)
[facility] Emergency Department Provider Note  Time seen: I have reviewed the triage vital signs and the nursing notes.   HISTORY  Chief Complaint Nasal Congestion and Cough   HPI Abigail Mejia is a 26 y.o. female presents to the emergency department with 2 week history of nasal congestion and nonproductive cough. Patient denies any fever afebrile on presentation with temperature 98.5. Patient states that she saw her primary care provider for the nasal congestion and was diagnosed with allergies for which she was prescribed Claritin.  Past Medical History:  Diagnosis Date  . Medical history non-contributory     Patient Active Problem List   Diagnosis Date Noted  . Abdominal pain 09/21/2014  . Indication for care in labor or delivery 09/21/2014    History reviewed. No pertinent surgical history.  Prior to Admission medications   Medication Sig Start Date End Date Taking? Authorizing Provider  azithromycin (ZITHROMAX Z-PAK) 250 MG tablet Take 2 tablets (500 mg) on  Day 1,  followed by 1 tablet (250 mg) once daily on Days 2 through 5. 10/10/16 10/15/16  Darci CurrentBrown, White Signal N, MD  brompheniramine-pseudoephedrine-DM 30-2-10 MG/5ML syrup Take 10 mLs by mouth 4 (four) times daily as needed. 04/20/15   Cuthriell, Delorise RoyalsJonathan D, PA-C  cetirizine (ZYRTEC) 10 MG tablet Take 1 tablet (10 mg total) by mouth daily. 04/20/15   Cuthriell, Delorise RoyalsJonathan D, PA-C  fluticasone (FLONASE) 50 MCG/ACT nasal spray Place 1 spray into both nostrils 2 (two) times daily. 04/20/15   Cuthriell, Delorise RoyalsJonathan D, PA-C  ibuprofen (ADVIL,MOTRIN) 600 MG tablet Take 1 tablet (600 mg total) by mouth every 6 (six) hours. 09/23/14   Vena AustriaStaebler, Andreas, MD  Iron-FA-B Cmp-C-Biot-Probiotic (FUSION PLUS) CAPS Take 1 capsule by mouth daily. 09/06/14   [provider]  ondansetron (ZOFRAN-ODT) 4 MG disintegrating tablet Take 1 tablet (4 mg total) by mouth every 8 (eight) hours as needed for nausea or vomiting.  04/20/15   Cuthriell, Delorise RoyalsJonathan D, PA-C  Prenatal Vit-Fe Fumarate-FA (PRENATAL MULTIVITAMIN) TABS tablet Take 1 tablet by mouth daily at 12 noon.    [provider]    Allergies Known drug alergies No family history on file.  Social History Social History  Substance Use Topics  . Smoking status: Never Smoker  . Smokeless tobacco: Never Used  . Alcohol use No    Review of Systems Constitutional: No fever/chills Eyes: No visual changes. ENT: No sore throat.positive for nasal congestion Cardiovascular: Denies chest pain. Respiratory: Denies shortness of breath. Gastrointestinal: No abdominal pain.  No nausea, no vomiting.  No diarrhea.  No constipation. Genitourinary: Negative for dysuria. Musculoskeletal: Negative for neck pain.  Negative for back pain. Integumentary: Negative for rash. Neurological: Negative for headaches, focal weakness or numbness.   ____________________________________________   PHYSICAL EXAM:  VITAL SIGNS: ED Triage Vitals  Enc Vitals Group     BP 10/09/16 2213 111/61     Pulse Rate 10/09/16 2213 72     Resp 10/09/16 2213 20     Temp 10/09/16 2213 98.5 F (36.9 C)     Temp Source 10/09/16 2213 Oral     SpO2 10/09/16 2213 99 %     Weight 10/09/16 2211 104.3 kg (230 lb)     Height 10/09/16 2211 1.626 m (5\' 4" )     Head Circumference --      Peak Flow --      Pain Score --      Pain Loc --      Pain  Edu? --      Excl. in GC? --     Constitutional: Alert and oriented. Well appearing and in no acute distress. Eyes: Conjunctivae are normal.  Head: Atraumatic.Pain with maxillary sinus percussion on the right Mouth/Throat: Mucous membranes are moist.  Oropharynx non-erythematous. Neck: No stridor.   Cardiovascular: Normal rate, regular rhythm. Good peripheral circulation. Grossly normal heart sounds. Respiratory: Normal respiratory effort.  No retractions. Lungs CTAB. Gastrointestinal: Soft and nontender. No distention.    Musculoskeletal: No lower extremity tenderness nor edema. No gross deformities of extremities. Neurologic:  Normal speech and language. No gross focal neurologic deficits are appreciated.  Skin:  Skin is warm, dry and intact. No rash noted. Psychiatric: Mood and affect are normal. Speech and behavior are normal.    Procedures   ____________________________________________   INITIAL IMPRESSION / ASSESSMENT AND PLAN / ED COURSE  Pertinent labs & imaging results that were available during my care of the patient were reviewed by me and considered in my medical decision making (see chart for details).   26 year old female presented with history of physical exam concerning for acute sinusitis. Given duration of symptoms concern for possible bacterial etiology such patient given azithromycin emergency department will be prescribed same for home.     ____________________________________________  FINAL CLINICAL IMPRESSION(S) / ED DIAGNOSES  Final diagnoses:  Acute non-recurrent maxillary sinusitis     MEDICATIONS GIVEN DURING THIS VISIT:  Medications  azithromycin (ZITHROMAX) tablet 500 mg (500 mg Oral Given 10/10/16 0042)     NEW OUTPATIENT MEDICATIONS STARTED DURING THIS VISIT:  Discharge Medication List as of 10/10/2016 12:58 AM    START taking these medications   Details  azithromycin (ZITHROMAX Z-PAK) 250 MG tablet Take 2 tablets (500 mg) on  Day 1,  followed by 1 tablet (250 mg) once daily on Days 2 through 5., Print        Discharge Medication List as of 10/10/2016 12:58 AM      Discharge Medication List as of 10/10/2016 12:58 AM       Note:  This document was prepared using Dragon voice recognition software and may include unintentional dictation errors.    Darci Current, MD 10/10/16 [phone]

## 2017-07-30 ENCOUNTER — Encounter: Payer: Self-pay | Admitting: Maternal Newborn

## 2017-08-04 ENCOUNTER — Ambulatory Visit (INDEPENDENT_AMBULATORY_CARE_PROVIDER_SITE_OTHER): Payer: Medicaid Other | Admitting: Maternal Newborn

## 2017-08-04 ENCOUNTER — Encounter: Payer: Self-pay | Admitting: Maternal Newborn

## 2017-08-04 VITALS — BP 110/70 | HR 64 | Ht 64.0 in | Wt 230.0 lb

## 2017-08-04 DIAGNOSIS — Z01419 Encounter for gynecological examination (general) (routine) without abnormal findings: Secondary | ICD-10-CM

## 2017-08-04 DIAGNOSIS — Z3046 Encounter for surveillance of implantable subdermal contraceptive: Secondary | ICD-10-CM | POA: Diagnosis not present

## 2017-08-04 DIAGNOSIS — Z124 Encounter for screening for malignant neoplasm of cervix: Secondary | ICD-10-CM

## 2017-08-04 DIAGNOSIS — Z0000 Encounter for general adult medical examination without abnormal findings: Secondary | ICD-10-CM | POA: Diagnosis not present

## 2017-08-04 LAB — HM PAP SMEAR: HM Pap smear: NEGATIVE

## 2017-08-04 NOTE — Progress Notes (Signed)
GYNECOLOGY PROCEDURE NOTE  Nexplanon removal discussed in detail.  Risks of infection, bleeding, nerve injury all reviewed.  Patient understands risks and desires to proceed.  Verbal consent obtained.  Patient is certain she wants the Nexplanon removed.  All questions answered.  Procedure: Patient placed in dorsal supine with left arm above head, elbow flexed at 90 degrees, arm resting on examination table.  Nexplanon identified without problems.  Betadine scrub x3.  1 ml of 1% lidocaine injected under implanon device without problems.  Sterile gloves applied.  Small 0.5cm incision made at distal tip of Nexplanon device.Marland Kitchen.  Nexplanon brought to incision and grasped with a small Kelly clamp.  Nexplanon removed intact without problems.  Pressure applied to incision.  Hemostasis obtained.  Steri-strips applied, followed by bandage and compression dressing.  Patient tolerated procedure well.  No complications.   Assessment: 27 y.o. year old female now s/p Nexplanon removal.  Plan: 1.  Patient given post procedure precautions and asked to call for fever, chills, redness or drainage from her incision, bleeding from incision.  She understands she will likely have a small bruise near site of removal and can remove bandage tomorrow and steri-strips in approximately 1 week.  2) Contraception: does not desire hormonal contraception at this time.  Marcelyn BruinsJacelyn Schmid, CNM

## 2017-08-04 NOTE — Progress Notes (Signed)
Gynecology Annual Exam  [facility]  Chief Complaint:  Chief Complaint  Patient presents with  . Gynecologic Exam  . nexplanon removal    History of Present Illness: Patient is a 27 y.o. G2P2001 presents for an annual exam and Nexplanon removal The patient has no complaints today.   LMP: Patient's last menstrual period was 08/03/2017. Average Interval: irregular with Nexplanon Heavy Menses: no  The patient is not currently sexually active. She currently uses Nexplanon for contraception. She denies dyspareunia.  The patient does not perform self breast exams.  There is no notable family history of breast or ovarian cancer in her family.  The patient wears seatbelts: yes.   The patient has regular exercise: no.    The patient denies current symptoms of depression.    Review of Systems  Constitutional: Negative.   HENT:       Sneezing with environmental allergies  Eyes: Negative.   Respiratory: Positive for cough. Negative for shortness of breath and wheezing.   Cardiovascular: Negative for chest pain and palpitations.  Gastrointestinal: Negative for abdominal pain, constipation, diarrhea, heartburn and nausea.  Genitourinary: Negative.   Musculoskeletal: Negative.   Skin: Negative.   Neurological: Negative.   Endo/Heme/Allergies: Positive for environmental allergies.  Psychiatric/Behavioral: Negative for depression. The patient is not nervous/anxious.     Past Medical History:  Past Medical History:  Diagnosis Date  . Chronic low back pain without sciatica 12/21/2015  . Medical history non-contributory     Past Surgical History:  Past Surgical History:  Procedure Laterality Date  . nexplanon insertion  2012;10/21/2013;    Gynecologic History:  Patient's last menstrual period was 08/03/2017. Contraception: Nexplanon Last Pap: 11/16/2014.  Results were: NIL  Obstetric History: G2P2001  Family History:  History reviewed. No  pertinent family history.  Social History:  Social History   Socioeconomic History  . Marital status: Single    Spouse name: Not on file  . Number of children: 2  . Years of education: 6012  . Highest education level: Not on file  Occupational History  . 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 Financial resource strain: Not on file  . Food insecurity:    Worry: Not on file    Inability: Not on file  . Transportation needs:    Medical: Not on file    Non-medical: Not on file  Tobacco Use  . Smoking status: Never Smoker  . Smokeless tobacco: Never Used  Substance and Sexual Activity  . Alcohol use: No  . Drug use: No  . Sexual activity: Not Currently    Birth control/protection: None  Lifestyle  . Physical activity:    Days per week: Not on file    Minutes per session: Not on file  . Stress: Not on file  Relationships  . Social connections:    Talks on phone: Not on file    Gets together: Not on file    Attends religious service: Not on file    Active member of club or organization: Not on file    Attends meetings of clubs or organizations: Not on file    Relationship status: Not on file  . Intimate partner violence:    Fear of current or ex partner: Not on file    Emotionally abused: Not on file    Physically abused: Not on file    Forced sexual activity: Not on file  Other Topics Concern  . Not on file  Social History  Narrative  . Not on file    Allergies:  No Known Allergies  Medications: Prior to Admission medications   Medication Sig Start Date End Date Taking? Authorizing Provider  brompheniramine-pseudoephedrine-DM 30-2-10 MG/5ML syrup Take 10 mLs by mouth 4 (four) times daily as needed. 04/20/15   Cuthriell, Delorise Royals, PA-C  cetirizine (ZYRTEC) 10 MG tablet Take 1 tablet (10 mg total) by mouth daily. 04/20/15   Cuthriell, Delorise Royals, PA-C  fluticasone (FLONASE) 50 MCG/ACT nasal spray Place 1 spray into both nostrils 2 (two) times daily. 04/20/15    Cuthriell, Delorise Royals, PA-C  ibuprofen (ADVIL,MOTRIN) 600 MG tablet Take 1 tablet (600 mg total) by mouth every 6 (six) hours. 09/23/14   Vena Austria, MD  Iron-FA-B Cmp-C-Biot-Probiotic (FUSION PLUS) CAPS Take 1 capsule by mouth daily. 09/06/14   [provider]  ondansetron (ZOFRAN-ODT) 4 MG disintegrating tablet Take 1 tablet (4 mg total) by mouth every 8 (eight) hours as needed for nausea or vomiting. 04/20/15   Cuthriell, Delorise Royals, PA-C  Prenatal Vit-Fe Fumarate-FA (PRENATAL MULTIVITAMIN) TABS tablet Take 1 tablet by mouth daily at 12 noon.    [provider]    Physical Exam Vitals: Blood pressure 110/70, pulse 64, height 5\' 4"  (1.626 m), weight 230 lb (104.3 kg), last menstrual period 08/03/2017.  General: NAD HEENT: normocephalic, anicteric Thyroid: no enlargement, no palpable nodules Pulmonary: No increased work of breathing, CTAB Cardiovascular: RRR Breast: Breasts symmetrical, no tenderness, no palpable nodules or masses, no skin or nipple retraction present, no nipple discharge.  No axillary or supraclavicular lymphadenopathy. Abdomen: soft, non-tender, non-distended.  Umbilicus without lesions.  No hepatomegaly, splenomegaly or masses palpable. No evidence of hernia  Genitourinary:  External: Normal external female genitalia.  Normal urethral meatus, normal  Bartholin's and Skene's glands.    Vagina: Normal vaginal mucosa, no evidence of prolapse.    Cervix: Grossly normal in appearance, no bleeding  Uterus: Non-enlarged, mobile, normal contour.  No CMT  Adnexa: ovaries non-enlarged, no adnexal masses  Rectal: deferred  Lymphatic: no evidence of inguinal lymphadenopathy Extremities: no edema, erythema, or tenderness Neurologic: Grossly intact Psychiatric: mood appropriate, affect full  Assessment: 27 y.o. G2P2001 routine annual exam and Nexplanon removal.  Plan: Problem List Items Addressed This Visit    None    Visit Diagnoses    Women's annual  routine gynecological examination    -  Primary   Encounter for Nexplanon removal       Pap smear for cervical cancer screening       Relevant Orders   Pap IG w/ reflex to HPV when ASC-U (Completed)      1) STI screening was offered and declined.  2) ASCCP guidelines and rationale discussed.  Patient opts for every 3 year screening interval.  3) Contraception - Education given regarding options for contraception; she does not desire hormonal contraception at this time.  4) Routine healthcare maintenance including cholesterol, diabetes screening:  Declines  5) Follow up 1 year for routine annual exam.  Marcelyn Bruins, CNM 08/04/2017  2:28 PM

## 2017-08-07 LAB — PAP IG W/ RFLX HPV ASCU: PAP Smear Comment: 0

## 2017-08-09 ENCOUNTER — Encounter: Payer: Self-pay | Admitting: Maternal Newborn

## 2017-08-09 NOTE — Progress Notes (Signed)
This encounter was created in error - please disregard.

## 2018-03-02 LAB — HM HIV SCREENING LAB: HM HIV Screening: NEGATIVE

## 2018-03-15 ENCOUNTER — Emergency Department
Admission: EM | Admit: 2018-03-15 | Discharge: 2018-03-15 | Disposition: A | Discharge status: Home / Self Care | Payer: Medicaid Other | Attending: Emergency Medicine | Admitting: Emergency Medicine

## 2018-03-15 ENCOUNTER — Encounter: Payer: Self-pay | Admitting: Emergency Medicine

## 2018-03-15 ENCOUNTER — Other Ambulatory Visit: Payer: Self-pay

## 2018-03-15 DIAGNOSIS — E860 Dehydration: Secondary | ICD-10-CM | POA: Insufficient documentation

## 2018-03-15 DIAGNOSIS — J101 Influenza due to other identified influenza virus with other respiratory manifestations: Secondary | ICD-10-CM | POA: Insufficient documentation

## 2018-03-15 DIAGNOSIS — R509 Fever, unspecified: Secondary | ICD-10-CM | POA: Diagnosis present

## 2018-03-15 LAB — POCT PREGNANCY, URINE: Preg Test, Ur: NEGATIVE

## 2018-03-15 LAB — URINALYSIS, COMPLETE (UACMP) WITH MICROSCOPIC
Bacteria, UA: NONE SEEN
Bilirubin Urine: NEGATIVE
Glucose, UA: NEGATIVE mg/dL
Hgb urine dipstick: NEGATIVE
Ketones, ur: 20 mg/dL — AB
Nitrite: NEGATIVE
Protein, ur: 30 mg/dL — AB
Specific Gravity, Urine: 1.027 (ref 1.005–1.030)
pH: 6 (ref 5.0–8.0)

## 2018-03-15 LAB — COMPREHENSIVE METABOLIC PANEL
ALT: 26 U/L (ref 0–44)
AST: 37 U/L (ref 15–41)
Albumin: 4.3 g/dL (ref 3.5–5.0)
Alkaline Phosphatase: 57 U/L (ref 38–126)
Anion gap: 10 (ref 5–15)
BUN: 12 mg/dL (ref 6–20)
CO2: 24 mmol/L (ref 22–32)
Calcium: 8.7 mg/dL — ABNORMAL LOW (ref 8.9–10.3)
Chloride: 103 mmol/L (ref 98–111)
Creatinine, Ser: 0.92 mg/dL (ref 0.44–1.00)
GFR calc Af Amer: >60 mL/min (ref >60)
GFR calc non Af Amer: >60 mL/min (ref >60)
Glucose, Bld: 78 mg/dL (ref 70–99)
Potassium: 3.5 mmol/L (ref 3.5–5.1)
Sodium: 137 mmol/L (ref 135–145)
Total Bilirubin: 0.8 mg/dL (ref 0.3–1.2)
Total Protein: 7.7 g/dL (ref 6.5–8.1)

## 2018-03-15 LAB — CBC WITH DIFFERENTIAL/PLATELET
Abs Immature Granulocytes: 0.01 10*3/uL (ref 0.00–0.07)
Basophils Absolute: 0 10*3/uL (ref 0.0–0.1)
Basophils Relative: 0 %
Eosinophils Absolute: 0 10*3/uL (ref 0.0–0.5)
Eosinophils Relative: 0 %
HCT: 42.4 % (ref 36.0–46.0)
Hemoglobin: 14.5 g/dL (ref 12.0–15.0)
Immature Granulocytes: 0 %
Lymphocytes Relative: 29 %
Lymphs Abs: 0.7 10*3/uL (ref 0.7–4.0)
MCH: 31.8 pg (ref 26.0–34.0)
MCHC: 34.2 g/dL (ref 30.0–36.0)
MCV: 93 fL (ref 80.0–100.0)
Monocytes Absolute: 0.5 10*3/uL (ref 0.1–1.0)
Monocytes Relative: 23 %
Neutro Abs: 1.1 10*3/uL — ABNORMAL LOW (ref 1.7–7.7)
Neutrophils Relative %: 48 %
Platelets: 207 10*3/uL (ref 150–400)
RBC: 4.56 MIL/uL (ref 3.87–5.11)
RDW: 12 % (ref 11.5–15.5)
WBC: 2.3 10*3/uL — ABNORMAL LOW (ref 4.0–10.5)
nRBC: 0 % (ref 0.0–0.2)

## 2018-03-15 LAB — INFLUENZA PANEL BY PCR (TYPE A & B)
Influenza A By PCR: NEGATIVE
Influenza B By PCR: POSITIVE — AB

## 2018-03-15 LAB — LIPASE, BLOOD: Lipase: 40 U/L (ref 11–51)

## 2018-03-15 MED ORDER — ONDANSETRON 4 MG PO TBDP: 4.0000 mg | ORAL_TABLET | Freq: Three times a day (TID) | ORAL | 0 refills | Status: DC | PRN

## 2018-03-15 MED ORDER — ONDANSETRON HCL 4 MG/2ML IJ SOLN
4.0000 mg | Freq: Once | INTRAMUSCULAR | Status: AC
  Administered 2018-03-15: 4 mg via INTRAVENOUS
  Filled 2018-03-15: qty 2

## 2018-03-15 MED ORDER — SODIUM CHLORIDE 0.9 % IV BOLUS
1000.0000 mL | Freq: Once | INTRAVENOUS | Status: AC
  Administered 2018-03-15: 1000 mL via INTRAVENOUS

## 2018-03-15 MED ORDER — ACETAMINOPHEN 325 MG PO TABS
650.0000 mg | ORAL_TABLET | Freq: Once | ORAL | Status: AC
  Administered 2018-03-15: 650 mg via ORAL
  Filled 2018-03-15: qty 2

## 2018-03-15 NOTE — Discharge Instructions (Signed)
Follow-up with your regular doctor if not better in 3 days.  Return the emergency department if worsening.  Drink plenty of fluids.  Tylenol and ibuprofen for a fever as needed.  You can use Zofran ODT if needed for nausea/vomiting.

## 2018-03-15 NOTE — ED Notes (Signed)
See triage note  Presents with low grade fever  Decreased appetiite and body aches

## 2018-03-15 NOTE — ED Triage Notes (Signed)
Cough and bodyaches x 4 days

## 2018-03-15 NOTE — ED Provider Notes (Signed)
[facility] Emergency Department Provider Note  ____________________________________________   None    (approximate)  I have reviewed the triage vital signs and the nursing notes.   HISTORY  Chief Complaint Cough and Generalized Body Aches    HPI Abigail Mejia is a 28 y.o. female presents to the emergency department flulike symptoms.  Low-grade fever.  Symptoms started last Friday with fever starting on Saturday.  She has had decreased appetite.  Body aches.  Nausea/vomiting.  Last vomited last night.  Diarrhea and last episode of diarrhea was last night.  Positive for cough.  No burning with urination at this time.  She denies any abdominal pain.   She states that the cough is dry.  She is afraid she is getting dehydrated.   Past Medical History:  Diagnosis Date  . Chronic low back pain without sciatica 12/21/2015  . Medical history non-contributory     Patient Active Problem List   Diagnosis Date Noted  . Abdominal pain 09/21/2014    Past Surgical History:  Procedure Laterality Date  . nexplanon insertion  2012;10/21/2013;    Prior to Admission medications   Medication Sig Start Date End Date Taking? Authorizing Provider  cetirizine (ZYRTEC) 10 MG tablet Take 1 tablet (10 mg total) by mouth daily. 04/20/15   Cuthriell, Delorise Royals, PA-C  Cholecalciferol (VITAMIN D3) 50000 units CAPS Take 1 capsule by mouth once a week. 05/19/17   [provider]  FLOVENT HFA 220 MCG/ACT inhaler Place 2 puffs into the nose 2 (two) times daily. 06/23/17   [provider]  fluticasone (FLONASE) 50 MCG/ACT nasal spray Place 1 spray into both nostrils 2 (two) times daily. 04/20/15   Cuthriell, Delorise Royals, PA-C  ondansetron (ZOFRAN-ODT) 4 MG disintegrating tablet Take 1 tablet (4 mg total) by mouth every 8 (eight) hours as needed for nausea or vomiting. 03/15/18   Sherrie Mustache, Roselyn Bering, PA-C  PROAIR HFA 108 [phone] Base) MCG/ACT inhaler Place 2 puffs into the nose  every 6 (six) hours as needed. 06/12/17   [provider]    Allergies Patient has no known allergies.  No family history on file.  Social History Social History   Tobacco Use  . Smoking status: Never Smoker  . Smokeless tobacco: Never Used  Substance Use Topics  . Alcohol use: No  . Drug use: No    Review of Systems  Constitutional: Positive fever/chills Eyes: No visual changes. ENT: No sore throat. Respiratory: Positive cough Intestinal: Positive for vomiting and diarrhea, last episodes were last night Genitourinary: Negative for dysuria. Musculoskeletal: Negative for back pain. Skin: Negative for rash.    ____________________________________________   PHYSICAL EXAM:  VITAL SIGNS: ED Triage Vitals [03/15/18 1356]  Enc Vitals Group     BP 136/86     Pulse Rate (!) 110     Resp 20     Temp 100.3 F (37.9 C)     Temp Source Oral     SpO2 97 %     Weight 214 lb (97.1 kg)     Height 5\' 4"  (1.626 m)     Head Circumference      Peak Flow      Pain Score 10     Pain Loc      Pain Edu?      Excl. in GC?     Constitutional: Alert and oriented. Well appearing and in no acute distress. Eyes: Conjunctivae are normal.  Head: Atraumatic. Nose: No congestion/rhinnorhea. Mouth/Throat:  Mucous membranes are moist.   Neck:  supple no lymphadenopathy noted Cardiovascular: Normal rate, regular rhythm. Heart sounds are normal Respiratory: Normal respiratory effort.  No retractions, lungs c t a  Abd: soft nontender bs normal all 4 quad, no McBurney's point tenderness or Murphy sign noted GU: deferred Musculoskeletal: FROM all extremities, warm and well perfused Neurologic:  Normal speech and language.  Skin:  Skin is warm, dry and intact. No rash noted. Psychiatric: Mood and affect are normal. Speech and behavior are normal.  ____________________________________________   LABS (all labs ordered are listed, but only abnormal results are displayed)  Labs  Reviewed  COMPREHENSIVE METABOLIC PANEL - Abnormal; Notable for the following components:      Result Value   Calcium 8.7 (*)    All other components within normal limits  CBC WITH DIFFERENTIAL/PLATELET - Abnormal; Notable for the following components:   WBC 2.3 (*)    Neutro Abs 1.1 (*)    All other components within normal limits  INFLUENZA PANEL BY PCR (TYPE A & B) - Abnormal; Notable for the following components:   Influenza B By PCR POSITIVE (*)    All other components within normal limits  URINALYSIS, COMPLETE (UACMP) WITH MICROSCOPIC - Abnormal; Notable for the following components:   Color, Urine YELLOW (*)    APPearance CLEAR (*)    Ketones, ur 20 (*)    Protein, ur 30 (*)    Leukocytes, UA SMALL (*)    All other components within normal limits  LIPASE, BLOOD  POC URINE PREG, ED  POCT PREGNANCY, URINE   ____________________________________________   ____________________________________________  RADIOLOGY    ____________________________________________   PROCEDURES  Procedure(s) performed: Saline lock, normal saline 1 L IV, Zofran 4 mg IV, Tylenol 650 mg p.o.  Procedures    ____________________________________________   INITIAL IMPRESSION / ASSESSMENT AND PLAN / ED COURSE  Pertinent labs & imaging results that were available during my care of the patient were reviewed by me and considered in my medical decision making (see chart for details).   Patient is a 28 year old female presents emergency department with flulike symptoms.  She states she has been sick for 4 days and feels like she is getting dehydrated.  Physical exam shows that the patient is febrile and tachycardic.  She appears to not feel well.  Remainder the exam is unremarkable  POC pregnancy is negative, comprehensive metabolic panel is basically normal, lipase is negative, urinalysis has 20 ketones with a small amount of leuks, CBC has low WBC.  Influenza test is positive for influenza  B.  Explained all of the test results to the patient.  On recheck the patient's heart rate is better and she states she feels much better.  She is to follow-up with her regular doctor for repeat WBC in approximately 1 week.  She is to take Tylenol/ibuprofen for fever as needed.  Drink plenty of fluids.  Follow-up with your regular doctor if not improving in 3 to 5 days.  Return emergency department worsening.  She states she understands and will comply.  She was discharged in stable condition in the care of her family member.     As part of my medical decision making, I reviewed the following data within the electronic MEDICAL RECORD NUMBER History obtained from family, Nursing notes reviewed and incorporated, Labs reviewed see above, Old chart reviewed, Notes from prior ED visits and [place] Controlled Substance Database  ____________________________________________   FINAL CLINICAL IMPRESSION(S) / ED DIAGNOSES  Final diagnoses:  Influenza B  Dehydration      NEW MEDICATIONS STARTED DURING THIS VISIT:  Discharge Medication List as of 03/15/2018  5:40 PM    START taking these medications   Details  ondansetron (ZOFRAN-ODT) 4 MG disintegrating tablet Take 1 tablet (4 mg total) by mouth every 8 (eight) hours as needed for nausea or vomiting., Starting Mon 03/15/2018, Normal         Note:  This document was prepared using Dragon voice recognition software and may include unintentional dictation errors.    Faythe Ghee, PA-C 03/15/18 2139    Schaevitz, Myra Rude, MD 03/16/18 [phone]

## 2018-05-06 ENCOUNTER — Encounter: Payer: Self-pay | Admitting: *Deleted

## 2018-05-06 ENCOUNTER — Other Ambulatory Visit: Payer: Self-pay

## 2018-05-06 NOTE — Discharge Instructions (Signed)
[facility] °[facility] °ENDOSCOPIC SINUS SURGERY °[facility] ° °What is Functional Endoscopic Sinus Surgery? ° The Surgery involves making the natural openings of the sinuses larger by removing the bony partitions that separate the sinuses from the nasal cavity.  The natural sinus lining is preserved as much as possible to allow the sinuses to resume normal function after the surgery.  In some patients nasal polyps (excessively swollen lining of the sinuses) may be removed to relieve obstruction of the sinus openings.  The surgery is performed through the nose using lighted scopes, which eliminates the need for incisions on the face.  A septoplasty is a different procedure which is sometimes performed with sinus surgery.  It involves straightening the boy partition that separates the two sides of your nose.  A crooked or deviated septum may need repair if is obstructing the sinuses or nasal airflow.  Turbinate reduction is also often performed during sinus surgery.  The turbinates are bony proturberances from the side walls of the nose which swell and can obstruct the nose in patients with sinus and allergy problems.  Their size can be surgically reduced to help relieve nasal obstruction. ° °What Can Sinus Surgery Do For Me? ° Sinus surgery can reduce the frequency of sinus infections requiring antibiotic treatment.  This can provide improvement in nasal congestion, post-nasal drainage, facial pressure and nasal obstruction.  Surgery will NOT prevent you from ever having an infection again, so it usually only for patients who get infections 4 or more times yearly requiring antibiotics, or for infections that do not clear with antibiotics.  It will not cure nasal allergies, so patients with allergies may still require medication to treat their allergies after surgery. Surgery may improve headaches related to sinusitis, however, some people will continue to  require medication to control sinus headaches related to allergies.  Surgery will do nothing for other forms of headache (migraine, tension or cluster). ° °What Are the Risks of Endoscopic Sinus Surgery? ° Current techniques allow surgery to be performed safely with little risk, however, there are rare complications that patients should be aware of.  Because the sinuses are located around the eyes, there is risk of eye injury, including blindness, though again, this would be quite rare. This is usually a result of bleeding behind the eye during surgery, which puts the vision oat risk, though there are treatments to protect the vision and prevent permanent disrupted by surgery causing a leak of the spinal fluid that surrounds the brain.  More serious complications would include bleeding inside the brain cavity or damage to the brain.  Again, all of these complications are uncommon, and spinal fluid leaks can be safely managed surgically if they occur.  The most common complication of sinus surgery is bleeding from the nose, which may require packing or cauterization of the nose.  Continued sinus have polyps may experience recurrence of the polyps requiring revision surgery.  Alterations of sense of smell or injury to the tear ducts are also rare complications.  ° °What is the Surgery Like, and what is the Recovery? ° The Surgery usually takes a couple of hours to perform, and is usually performed under a general anesthetic (completely asleep).  Patients are usually discharged home after a couple of hours.  Sometimes during surgery it is necessary to pack the nose to control bleeding, and the packing is left in place for 24 - 48 hours, and removed by your surgeon.    If a septoplasty was performed during the procedure, there is often a splint placed which must be removed after 5-7 days.   °Discomfort: Pain is usually mild to moderate, and can be controlled by prescription pain medication or acetaminophen (Tylenol).   Aspirin, Ibuprofen (Advil, Motrin), or Naprosyn (Aleve) should be avoided, as they can cause increased bleeding.  Most patients feel sinus pressure like they have a bad head cold for several days.  Sleeping with your head elevated can help reduce swelling and facial pressure, as can ice packs over the face.  A humidifier may be helpful to keep the mucous and blood from drying in the nose.  ° °Diet: There are no specific diet restrictions, however, you should generally start with clear liquids and a light diet of bland foods because the anesthetic can cause some nausea.  Advance your diet depending on how your stomach feels.  Taking your pain medication with food will often help reduce stomach upset which pain medications can cause. ° °Nasal Saline Irrigation: It is important to remove blood clots and dried mucous from the nose as it is healing.  This is done by having you irrigate the nose at least 3 - 4 times daily with a salt water solution.  We recommend using NeilMed Sinus Rinse (available at the drug store).  Fill the squeeze bottle with the solution, bend over a sink, and insert the tip of the squeeze bottle into the nose ½ of an inch.  Point the tip of the squeeze bottle towards the inside corner of the eye on the same side your irrigating.  Squeeze the bottle and gently irrigate the nose.  If you bend forward as you do this, most of the fluid will flow back out of the nose, instead of down your throat.   The solution should be warm, near body temperature, when you irrigate.   Each time you irrigate, you should use a full squeeze bottle.  ° °Note that if you are instructed to use Nasal Steroid Sprays at any time after your surgery, irrigate with saline BEFORE using the steroid spray, so you do not wash it all out of the nose. °Another product, Nasal Saline Gel (such as AYR Nasal Saline Gel) can be applied in each nostril 3 - 4 times daily to moisture the nose and reduce scabbing or crusting. ° °Bleeding:   Bloody drainage from the nose can be expected for several days, and patients are instructed to irrigate their nose frequently with salt water to help remove mucous and blood clots.  The drainage may be dark red or brown, though some fresh blood may be seen intermittently, especially after irrigation.  Do not blow you nose, as bleeding may occur. If you must sneeze, keep your mouth open to allow air to escape through your mouth. ° °If heavy bleeding occurs: Irrigate the nose with saline to rinse out clots, then spray the nose 3 - 4 times with Afrin Nasal Decongestant Spray.  The spray will constrict the blood vessels to slow bleeding.  Pinch the lower half of your nose shut to apply pressure, and lay down with your head elevated.  Ice packs over the nose may help as well. If bleeding persists despite these measures, you should notify your doctor.  Do not use the Afrin routinely to control nasal congestion after surgery, as it can result in worsening congestion and may affect healing.  ° °Activity: Return to work varies among patients. Most patients will be out of   work at least 5 - 7 days to recover.  Patient may return to work after they are off of narcotic pain medication, and feeling well enough to perform the functions of their job.  Patients must avoid heavy lifting (over 10 pounds) or strenuous physical for 2 weeks after surgery, so your employer may need to assign you to light duty, or keep you out of work longer if light duty is not possible.  NOTE: you should not drive, operate dangerous machinery, do any mentally demanding tasks or make any important legal or financial decisions while on narcotic pain medication and recovering from the general anesthetic.  °  °Call Your Doctor Immediately if You Have Any of the Following: °1. Bleeding that you cannot control with the above measures °2. Loss of vision, double vision, bulging of the eye or black eyes. °3. Fever over 101 degrees °4. Neck stiffness with severe  headache, fever, nausea and change in mental state. °You are always encourage to call anytime with concerns, however, please call with requests for pain medication refills during office hours. ° °Office Endoscopy: During follow-up visits your doctor will remove any packing or splints that may have been placed and evaluate and clean your sinuses endoscopically.  Topical anesthetic will be used to make this as comfortable as possible, though you may want to take your pain medication prior to the visit.  How often this will need to be done varies from patient to patient.  After complete recovery from the surgery, you may need follow-up endoscopy from time to time, particularly if there is concern of recurrent infection or nasal polyps. ° ° °General Anesthesia, Adult, Care After °This sheet gives you information about how to care for yourself after your procedure. Your health care provider may also give you more specific instructions. If you have problems or questions, contact your health care provider. °What can I expect after the procedure? °After the procedure, the following side effects are common: °· Pain or discomfort at the IV site. °· Nausea. °· Vomiting. °· Sore throat. °· Trouble concentrating. °· Feeling cold or chills. °· Weak or tired. °· Sleepiness and fatigue. °· Soreness and body aches. These side effects can affect parts of the body that were not involved in surgery. °Follow these instructions at home: ° °For at least 24 hours after the procedure: °· Have a responsible adult stay with you. It is important to have someone help care for you until you are awake and alert. °· Rest as needed. °· Do not: °? Participate in activities in which you could fall or become injured. °? Drive. °? Use heavy machinery. °? Drink alcohol. °? Take sleeping pills or medicines that cause drowsiness. °? Make important decisions or sign legal documents. °? Take care of children on your own. °Eating and drinking °· Follow any  instructions from your health care provider about eating or drinking restrictions. °· When you feel hungry, start by eating small amounts of foods that are soft and easy to digest (bland), such as toast. Gradually return to your regular diet. °· Drink enough fluid to keep your urine pale yellow. °· If you vomit, rehydrate by drinking water, juice, or clear broth. °General instructions °· If you have sleep apnea, surgery and certain medicines can increase your risk for breathing problems. Follow instructions from your health care provider about wearing your sleep device: °? Anytime you are sleeping, including during daytime naps. °? While taking prescription pain medicines, sleeping medicines, or medicines that make   you drowsy. °· Return to your normal activities as told by your health care provider. Ask your health care provider what activities are safe for you. °· Take over-the-counter and prescription medicines only as told by your health care provider. °· If you smoke, do not smoke without supervision. °· Keep all follow-up visits as told by your health care provider. This is important. °Contact a health care provider if: °· You have nausea or vomiting that does not get better with medicine. °· You cannot eat or drink without vomiting. °· You have pain that does not get better with medicine. °· You are unable to pass urine. °· You develop a skin rash. °· You have a fever. °· You have redness around your IV site that gets worse. °Get help right away if: °· You have difficulty breathing. °· You have chest pain. °· You have blood in your urine or stool, or you vomit blood. °Summary °· After the procedure, it is common to have a sore throat or nausea. It is also common to feel tired. °· Have a responsible adult stay with you for the first 24 hours after general anesthesia. It is important to have someone help care for you until you are awake and alert. °· When you feel hungry, start by eating small amounts of foods  that are soft and easy to digest (bland), such as toast. Gradually return to your regular diet. °· Drink enough fluid to keep your urine pale yellow. °· Return to your normal activities as told by your health care provider. Ask your health care provider what activities are safe for you. °This information is not intended to replace advice given to you by your health care provider. Make sure you discuss any questions you have with your health care provider. °Document Released: 05/26/2000 Document Revised: 10/03/2016 Document Reviewed: 10/03/2016 °Elsevier Interactive Patient Education © 2019 [employer] ° °

## 2018-05-13 ENCOUNTER — Ambulatory Visit
Admission: RE | Admit: 2018-05-13 | Discharge: 2018-05-13 | Disposition: A | Discharge status: Home / Self Care | Payer: Medicaid Other | Attending: Otolaryngology | Admitting: Otolaryngology

## 2018-05-13 ENCOUNTER — Ambulatory Visit: Payer: Medicaid Other | Admitting: Anesthesiology

## 2018-05-13 ENCOUNTER — Encounter
Admission: RE | Disposition: A | Discharge status: Home / Self Care | Payer: Self-pay | Source: Home / Self Care | Attending: Otolaryngology

## 2018-05-13 DIAGNOSIS — J328 Other chronic sinusitis: Secondary | ICD-10-CM | POA: Diagnosis present

## 2018-05-13 DIAGNOSIS — J3489 Other specified disorders of nose and nasal sinuses: Secondary | ICD-10-CM | POA: Diagnosis not present

## 2018-05-13 DIAGNOSIS — Z79899 Other long term (current) drug therapy: Secondary | ICD-10-CM | POA: Diagnosis not present

## 2018-05-13 HISTORY — PX: MAXILLARY ANTROSTOMY: SHX2003

## 2018-05-13 HISTORY — PX: IMAGE GUIDED SINUS SURGERY: SHX6570

## 2018-05-13 HISTORY — PX: SPHENOIDECTOMY: SHX2421

## 2018-05-13 HISTORY — PX: ETHMOIDECTOMY: SHX5197

## 2018-05-13 HISTORY — PX: NASAL SINUS SURGERY: SHX719

## 2018-05-13 LAB — POCT PREGNANCY, URINE: Preg Test, Ur: NEGATIVE

## 2018-05-13 SURGERY — SINUS SURGERY, WITH IMAGING GUIDANCE
Anesthesia: General | Site: Nose | Laterality: Bilateral

## 2018-05-13 MED ORDER — OXYCODONE HCL 5 MG/5ML PO SOLN: 5.0000 mg | Freq: Once | ORAL | Status: AC | PRN

## 2018-05-13 MED ORDER — ACETAMINOPHEN 10 MG/ML IV SOLN
1000.0000 mg | Freq: Once | INTRAVENOUS | Status: AC
  Administered 2018-05-13: 1000 mg via INTRAVENOUS

## 2018-05-13 MED ORDER — LIDOCAINE HCL (CARDIAC) PF 100 MG/5ML IV SOSY
PREFILLED_SYRINGE | INTRAVENOUS | Status: DC | PRN
  Administered 2018-05-13: 40 mg via INTRAVENOUS

## 2018-05-13 MED ORDER — PHENYLEPHRINE HCL 0.5 % NA SOLN
NASAL | Status: DC | PRN
  Administered 2018-05-13: 15 mL via TOPICAL

## 2018-05-13 MED ORDER — ROCURONIUM BROMIDE 100 MG/10ML IV SOLN
INTRAVENOUS | Status: DC | PRN
  Administered 2018-05-13: 25 mg via INTRAVENOUS

## 2018-05-13 MED ORDER — PREDNISONE 10 MG PO TABS: ORAL_TABLET | ORAL | 0 refills | Status: DC

## 2018-05-13 MED ORDER — LACTATED RINGERS IV SOLN
INTRAVENOUS | Status: DC
  Administered 2018-05-13 (×2): via INTRAVENOUS

## 2018-05-13 MED ORDER — FENTANYL CITRATE (PF) 100 MCG/2ML IJ SOLN
25.0000 ug | INTRAMUSCULAR | Status: DC | PRN
  Administered 2018-05-13: 25 ug via INTRAVENOUS

## 2018-05-13 MED ORDER — ONDANSETRON HCL 4 MG/2ML IJ SOLN
INTRAMUSCULAR | Status: DC | PRN
  Administered 2018-05-13: 4 mg via INTRAVENOUS

## 2018-05-13 MED ORDER — DEXTROSE 5 % IV SOLN
2000.0000 mg | Freq: Once | INTRAVENOUS | Status: AC
  Administered 2018-05-13: 2000 mg via INTRAVENOUS

## 2018-05-13 MED ORDER — CEPHALEXIN 500 MG PO CAPS: 500.0000 mg | ORAL_CAPSULE | Freq: Two times a day (BID) | ORAL | 0 refills | Status: DC

## 2018-05-13 MED ORDER — SCOPOLAMINE 1 MG/3DAYS TD PT72
1.0000 | MEDICATED_PATCH | TRANSDERMAL | Status: DC
  Administered 2018-05-13: 1.5 mg via TRANSDERMAL

## 2018-05-13 MED ORDER — LIDOCAINE-EPINEPHRINE 1 %-1:100000 IJ SOLN
INTRAMUSCULAR | Status: DC | PRN
  Administered 2018-05-13: 3 mL

## 2018-05-13 MED ORDER — DEXAMETHASONE SODIUM PHOSPHATE 4 MG/ML IJ SOLN
INTRAMUSCULAR | Status: DC | PRN
  Administered 2018-05-13: 10 mg via INTRAVENOUS

## 2018-05-13 MED ORDER — OXYMETAZOLINE HCL 0.05 % NA SOLN
2.0000 | Freq: Once | NASAL | Status: AC
  Administered 2018-05-13: 2 via NASAL

## 2018-05-13 MED ORDER — PROPOFOL 10 MG/ML IV BOLUS
INTRAVENOUS | Status: DC | PRN
  Administered 2018-05-13: 150 mg via INTRAVENOUS

## 2018-05-13 MED ORDER — HYDROCODONE-ACETAMINOPHEN 5-325 MG PO TABS: 1.0000 | ORAL_TABLET | Freq: Four times a day (QID) | ORAL | 0 refills | Status: AC | PRN

## 2018-05-13 MED ORDER — FENTANYL CITRATE (PF) 100 MCG/2ML IJ SOLN
INTRAMUSCULAR | Status: DC | PRN
  Administered 2018-05-13: 50 ug via INTRAVENOUS
  Administered 2018-05-13: 25 ug via INTRAVENOUS
  Administered 2018-05-13: 50 ug via INTRAVENOUS
  Administered 2018-05-13: 100 ug via INTRAVENOUS
  Administered 2018-05-13: 25 ug via INTRAVENOUS
  Administered 2018-05-13 (×2): 50 ug via INTRAVENOUS

## 2018-05-13 MED ORDER — MIDAZOLAM HCL 5 MG/5ML IJ SOLN
INTRAMUSCULAR | Status: DC | PRN
  Administered 2018-05-13: 2 mg via INTRAVENOUS

## 2018-05-13 MED ORDER — ONDANSETRON HCL 4 MG PO TABS
4.0000 mg | ORAL_TABLET | Freq: Once | ORAL | Status: AC
  Administered 2018-05-13: 4 mg via ORAL

## 2018-05-13 MED ORDER — GLYCOPYRROLATE 0.2 MG/ML IJ SOLN
INTRAMUSCULAR | Status: DC | PRN
  Administered 2018-05-13: 0.1 mg via INTRAVENOUS

## 2018-05-13 MED ORDER — OXYCODONE HCL 5 MG PO TABS
5.0000 mg | ORAL_TABLET | Freq: Once | ORAL | Status: AC | PRN
  Administered 2018-05-13: 5 mg via ORAL

## 2018-05-13 SURGICAL SUPPLY — 35 items
BATTERY INSTRU NAVIGATION (MISCELLANEOUS) ×9 IMPLANT
CANISTER SUCT 1200ML W/VALVE (MISCELLANEOUS) ×3 IMPLANT
CATH IV 18X1 1/4 SAFELET (CATHETERS) ×3 IMPLANT
COAGULATOR SUCT 8FR VV (MISCELLANEOUS) ×3 IMPLANT
CUP MEDICINE 2OZ PLAST GRAD ST (MISCELLANEOUS) ×3 IMPLANT
ELECT REM PT RETURN 9FT ADLT (ELECTROSURGICAL) ×3
ELECTRODE REM PT RTRN 9FT ADLT (ELECTROSURGICAL) ×1 IMPLANT
GLOVE PI ULTRA LF STRL 7.5 (GLOVE) ×2 IMPLANT
GLOVE PI ULTRA NON LATEX 7.5 (GLOVE) ×4
GOWN STRL REUS W/ TWL LRG LVL3 (GOWN DISPOSABLE) ×1 IMPLANT
GOWN STRL REUS W/TWL LRG LVL3 (GOWN DISPOSABLE) ×2
IMPL PROPEL CONTOUR (Prosthesis and Implant ENT) IMPLANT
IMPLANT PROPEL CONTOUR (Prosthesis and Implant ENT) ×6 IMPLANT
IV CATH 18X1 1/4 SAFELET (CATHETERS) ×1
IV NS 500ML (IV SOLUTION) ×2
IV NS 500ML BAXH (IV SOLUTION) ×1 IMPLANT
KIT TURNOVER KIT A (KITS) ×3 IMPLANT
NDL ANESTHESIA 27G X 3.5 (NEEDLE) ×1 IMPLANT
NDL HYPO 27GX1-1/4 (NEEDLE) ×1 IMPLANT
NEEDLE ANESTHESIA  27G X 3.5 (NEEDLE) ×2
NEEDLE ANESTHESIA 27G X 3.5 (NEEDLE) ×1 IMPLANT
NEEDLE HYPO 27GX1-1/4 (NEEDLE) ×3 IMPLANT
NS IRRIG 500ML POUR BTL (IV SOLUTION) ×3 IMPLANT
PACK ENT CUSTOM (PACKS) ×3 IMPLANT
PACKING NASAL EPIS 4X2.4 XEROG (MISCELLANEOUS) ×8 IMPLANT
PATTIES SURGICAL .5 X3 (DISPOSABLE) ×3 IMPLANT
SHAVER DIEGO BLD STD TYPE A (BLADE) ×3 IMPLANT
SOL ANTI-FOG 6CC FOG-OUT (MISCELLANEOUS) ×1 IMPLANT
SOL FOG-OUT ANTI-FOG 6CC (MISCELLANEOUS) ×2
STRAP BODY AND KNEE 60X3 (MISCELLANEOUS) ×6 IMPLANT
SYR 3ML LL SCALE MARK (SYRINGE) ×3 IMPLANT
TOWEL OR 17X26 4PK STRL BLUE (TOWEL DISPOSABLE) ×3 IMPLANT
TRACKER CRANIALMASK (MASK) ×3 IMPLANT
TUBING DECLOG MULTIDEBRIDER (TUBING) ×3 IMPLANT
WATER STERILE IRR 250ML POUR (IV SOLUTION) ×3 IMPLANT

## 2018-05-13 NOTE — Anesthesia Postprocedure Evaluation (Signed)
Anesthesia Post Note  Patient: Abigail Mejia  Procedure(s) Performed: IMAGE GUIDED SINUS SURGERY WITH PROPEL IMPLANT (Bilateral Nose) MAXILLARY ANTROSTOMY removal of tissue (Bilateral Nose) FRONTAL SINUSOTOMY (Bilateral Nose) ETHMOIDECTOMY (Bilateral Nose) SPHENOIDECTOMY with tissue removal (Bilateral Nose)  Patient location during evaluation: PACU Anesthesia Type: General Level of consciousness: awake and alert Pain management: pain level controlled Vital Signs Assessment: post-procedure vital signs reviewed and stable Respiratory status: spontaneous breathing Cardiovascular status: blood pressure returned to baseline Anesthetic complications: no    Verner Chol, III,  Tobby Fawcett D

## 2018-05-13 NOTE — H&P (Signed)
H&P has been reviewed and patient reevaluated, no changes necessary. To be downloaded later.  

## 2018-05-13 NOTE — Anesthesia Preprocedure Evaluation (Addendum)
Anesthesia Evaluation  Patient identified by MRN, date of birth, ID band Patient awake    Reviewed: Allergy & Precautions, H&P , NPO status , Patient's Chart, lab work & pertinent test results  History of Anesthesia Complications Negative for: history of anesthetic complications  Airway Mallampati: I  TM Distance: >3 FB Neck ROM: full    Dental no notable dental hx.    Pulmonary neg pulmonary ROS,    Pulmonary exam normal breath sounds clear to auscultation       Cardiovascular negative cardio ROS Normal cardiovascular exam Rhythm:regular Rate:Normal     Neuro/Psych    GI/Hepatic negative GI ROS, Neg liver ROS,   Endo/Other  negative endocrine ROS  Renal/GU negative Renal ROS     Musculoskeletal   Abdominal   Peds  Hematology negative hematology ROS (+)   Anesthesia Other Findings   Reproductive/Obstetrics                            Anesthesia Physical Anesthesia Plan  ASA: II  Anesthesia Plan: General ETT   Post-op Pain Management:    Induction:   PONV Risk Score and Plan:   Airway Management Planned:   Additional Equipment:   Intra-op Plan:   Post-operative Plan:   Informed Consent: I have reviewed the patients History and Physical, chart, labs and discussed the procedure including the risks, benefits and alternatives for the proposed anesthesia with the patient or authorized representative who has indicated his/her understanding and acceptance.       Plan Discussed with:   Anesthesia Plan Comments:         Anesthesia Quick Evaluation

## 2018-05-13 NOTE — Transfer of Care (Signed)
Immediate Anesthesia Transfer of Care Note  Patient: Abigail Mejia  Procedure(s) Performed: IMAGE GUIDED SINUS SURGERY WITH PROPEL IMPLANT (Bilateral Nose) MAXILLARY ANTROSTOMY removal of tissue (Bilateral Nose) FRONTAL SINUSOTOMY (Bilateral Nose) ETHMOIDECTOMY (Bilateral Nose) SPHENOIDECTOMY with tissue removal (Bilateral Nose)  Patient Location: PACU  Anesthesia Type: General ETT  Level of Consciousness: awake, alert  and patient cooperative  Airway and Oxygen Therapy: Patient Spontanous Breathing and Patient connected to supplemental oxygen  Post-op Assessment: Post-op Vital signs reviewed, Patient's Cardiovascular Status Stable, Respiratory Function Stable, Patent Airway and No signs of Nausea or vomiting  Post-op Vital Signs: Reviewed and stable  Complications: No apparent anesthesia complications

## 2018-05-13 NOTE — Addendum Note (Signed)
Addendum  created 05/13/18 1720 by Jolayne Panther, MD   Order list changed

## 2018-05-13 NOTE — Anesthesia Procedure Notes (Signed)
Procedure Name: Intubation Date/Time: 05/13/2018 1:18 PM Performed by: Jimmy Picket, CRNA Pre-anesthesia Checklist: Patient identified, Emergency Drugs available, Suction available, Patient being monitored and Timeout performed Patient Re-evaluated:Patient Re-evaluated prior to induction Oxygen Delivery Method: Circle system utilized Preoxygenation: Pre-oxygenation with 100% oxygen Induction Type: IV induction Ventilation: Mask ventilation without difficulty Laryngoscope Size: Miller and 2 Grade View: Grade I Tube type: Oral Rae Tube size: 7.0 mm Number of attempts: 1 Placement Confirmation: ETT inserted through vocal cords under direct vision,  positive ETCO2 and breath sounds checked- equal and bilateral Tube secured with: Tape Dental Injury: Teeth and Oropharynx as per pre-operative assessment

## 2018-05-14 ENCOUNTER — Encounter: Payer: Self-pay | Admitting: Otolaryngology

## 2018-05-14 NOTE — Op Note (Signed)
05/14/2018  5:39 PM    Tiburcio Pea, [place]  [id number]   Pre-Op Dx: Chronic bilateral frontal sinusitis, chronic bilateral ethmoid sinusitis, chronic bilateral maxillary sinusitis, chronic bilateral sphenoid sinusitis  Post-op Dx: Same  Proc: Bilateral endoscopic total ethmoidectomies with sphenoid ectomy's and removal of contents, bilateral endoscopic maxillary antrostomy with removal of contents, bilateral endoscopic frontal sinusotomies, use of image guided system  Surg:  Beverly Sessions Tristina Sahagian  Anes:  GOT  EBL: 350 mL  Comp: None  Findings: Very inflamed sinuses throughout.  Every sinus had thick clear to gray mucus that was filling the sinus.  Procedure: The patient was brought to the operating room placed in a supine position.  She was given general anesthesia by oral endotracheal intubation.  Once she was asleep the nose was prepped using cottonoid pledgets soaked in 4% Xylocaine and Afrin to fill the nose both sides and provide vasoconstriction.  The image guided system was brought in and the CT scan was downloaded from the disc.  The template was applied the face and registered to the system.  There is 0.5 mm of variance.  The suction instruments were then registered to the face as well and there was good alignment between these and the CT scan.  She was prepped and draped in a sterile fashion.  The cotton pledgets were removed and the 0 degrees scope was used in both sides nose to visualize the airway.  The septum was straight then the turbinates were about normal size.  Approximately 2 mL of 1% Xylocaine with epi 1: 100,000 was used for infiltration along the uncinate process and middle turbinate on both sides.  The left side was addressed first.  The left middle turbinate was infractured visualize the middle meatus.  There is evidence of thick clear gray mucus suctioned from the middle meatus.  The uncinate process is incised and removed on the left side.  This shows an opening into the  left maxillary sinus and there is a lot of thick mucus in here.  This is widened further with backbiting forceps and the [facility] microdebrider.  The 30 degrees scope was used to visualize sinus better and there is very thick mucus that is filling it.  There is a ridge of mucus membrane on the posterior wall is keeping it from coming forward and this ridge of mucus membrane is removed.  This opens the natural ostium well and the entire left maxillary sinus is cleaned out.  The ethmoid sinuses then opened through the ethmoid bulla.  Each area so I opened up into has thick mucus that is hiding in it and then I can unroof the rest of the sinus.  There is thickened mucous membranes but no sign of pus in any of the sinuses.  There is a lot of inflammation and oozing that occurs as I continue to slowly open up the ethmoid air cells to find the fovea ethmoidalis.  Posteriorly also open up into the sphenoid sinus which is completely filled with thick mucus.  This is all suctioned from the sinus.  The anterior wall is widened using a sphenoid punch to make sure there is good opening from the sphenoid sinus and adequate drainage.  Using the 30 degrees scope the middle ethmoid air cells are opened and cleaned following the fovea ethmoidalis from posterior to anterior.  The frontal sinus image guided suction is used to open up the anterior ethmoid air cells and find the opening into the frontal sinus duct.  There is  thick mucus in the frontal sinus that this is all suctioned clear.  Frontal sinus opening is widened using the boss frontal sinus instruments.  This is widened to give a good opening to be able to see up into the frontal sinus and make sure this does not scar close.  This completed opening up of all the sinuses on the left side.  This area was very inflamed and several times I needed to pack it to settle down the oozing and work on the opposite side and then would come back to work more here and completely finish this.   Cottonoid pledgets were placed here temporarily while the other side was addressed.  The right side was addressed by infracture in the middle turbinate visualize the middle meatus again there was thick clear gray mucus that was suctioned from this area.  The uncinate process was removed using the side biter and through biting forceps.  And the microdebrider.  The maxillary antrum was widened and opened all the way to the natural ostium and back posteriorly.  There is thick mucus in the maxillary sinus and this was all suctioned clear as well.  30 degrees scope was used to make sure the natural ostium was opened and there is a good clearing at the antrum.  The image guided system was used to evaluate the depth of dissection at the ethmoids.  The bulla ethmoidalis was opened and the posterior ethmoid air cells were cleared out.  The party wall between the sphenoid and posterior ethmoid was opened and the sphenoid sinus was cleared of thick mucus that completely filled the sphenoid sinus.  The anterior wall was trimmed using the sphenoid punch to open this up to make sure he could see well into the complete sinus and it was completely cleared.  The 0 degrees scope was used for open up the middle ethmoid air cells.  The image guided system was used to make sure we were cleaning all the air cells up to the fovea ethmoidalis.  Dissection was carried anteriorly using the 30 and 70 degrees scopes to open up the anterior ethmoid air cells and break into the frontal sinus.  This opening was widened using the boss frontal sinus instruments again and cleaned to make sure there is a good opening into the frontal sinus.  The rest of the anterior ethmoid air cells were cleaned out as well.  A cottonoid pledget was placed here temporarily and the left side was addressed again.  The left side was visualized again using the 0 and 30 degrees scopes and all the sinuses were open.  There were no sinuses that I could find the left  unopened.  A propel contour stent was then placed into the left frontal sinus opening.  Xerogel was then placed into the anterior ethmoid and then further xerogel was placed in the posterior ethmoid at the sphenoid sinus opening.  Xerogel was then used to fill the rest of the ethmoid and help hold the middle turbinate medialized.  The right side was then revisualized and given all the sinuses were viewed using the 0 and 30 degrees scope and were open and clear there is no further sinus disease or unopened cells that I could find.  A propel contour nasal stent was then placed into the right frontal sinus opening as well.  Xerogel was used to fill the anterior ethmoid and then the posterior ethmoid, and then further to fill the rest of the middle meatus.  The  inferior airway was open and clear passage for breathing.  There is been some oozing going on throughout the procedure because of the inflammation.  This required extra time and the entire operative time for the case was 3 hours.  All the sinuses were opened and cleared of all the thick mucus.  The patient tolerated the procedure well.  She was awakened and taken to the recovery room in satisfactory condition.  There were no operative complications.  Dispo: To PACU to be discharged home  Plan: To rest at home with her head elevated.  She will start saline flushes tomorrow.  She will be on a prednisone taper from 30-0 over 6 days along with Keflex 500 mg twice daily for 1 week.  She is given Norco to use for pain if necessary.  We will follow-up in 1 week to make sure she is doing well.  She will call the office if she has any issues.  Cammy Copa  05/14/2018 5:39 PM

## 2018-05-17 LAB — SURGICAL PATHOLOGY

## 2018-07-12 ENCOUNTER — Encounter: Payer: Self-pay | Admitting: Otolaryngology

## 2018-07-13 ENCOUNTER — Encounter: Payer: Self-pay | Admitting: Otolaryngology

## 2018-11-30 ENCOUNTER — Emergency Department
Admission: EM | Admit: 2018-11-30 | Discharge: 2018-11-30 | Disposition: A | Discharge status: Home / Self Care | Payer: No Typology Code available for payment source | Attending: Emergency Medicine | Admitting: Emergency Medicine

## 2018-11-30 ENCOUNTER — Other Ambulatory Visit: Payer: Self-pay

## 2018-11-30 ENCOUNTER — Emergency Department: Payer: No Typology Code available for payment source

## 2018-11-30 ENCOUNTER — Encounter: Payer: Self-pay | Admitting: *Deleted

## 2018-11-30 DIAGNOSIS — G44319 Acute post-traumatic headache, not intractable: Secondary | ICD-10-CM | POA: Insufficient documentation

## 2018-11-30 DIAGNOSIS — Y9389 Activity, other specified: Secondary | ICD-10-CM | POA: Diagnosis not present

## 2018-11-30 DIAGNOSIS — Y92410 Unspecified street and highway as the place of occurrence of the external cause: Secondary | ICD-10-CM | POA: Diagnosis not present

## 2018-11-30 DIAGNOSIS — Y999 Unspecified external cause status: Secondary | ICD-10-CM | POA: Diagnosis not present

## 2018-11-30 DIAGNOSIS — Z79899 Other long term (current) drug therapy: Secondary | ICD-10-CM | POA: Diagnosis not present

## 2018-11-30 DIAGNOSIS — S39012A Strain of muscle, fascia and tendon of lower back, initial encounter: Secondary | ICD-10-CM | POA: Diagnosis not present

## 2018-11-30 DIAGNOSIS — S3992XA Unspecified injury of lower back, initial encounter: Secondary | ICD-10-CM | POA: Diagnosis present

## 2018-11-30 LAB — POCT PREGNANCY, URINE: Preg Test, Ur: NEGATIVE

## 2018-11-30 MED ORDER — MELOXICAM 15 MG PO TABS: 15.0000 mg | ORAL_TABLET | Freq: Every day | ORAL | 0 refills | Status: DC

## 2018-11-30 MED ORDER — METHOCARBAMOL 500 MG PO TABS: 500.0000 mg | ORAL_TABLET | Freq: Four times a day (QID) | ORAL | 0 refills | Status: DC
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
## 2019-02-16 ENCOUNTER — Ambulatory Visit (INDEPENDENT_AMBULATORY_CARE_PROVIDER_SITE_OTHER): Payer: Medicaid Other | Admitting: Advanced Practice Midwife

## 2019-02-16 ENCOUNTER — Encounter: Payer: Self-pay | Admitting: Advanced Practice Midwife

## 2019-02-16 ENCOUNTER — Other Ambulatory Visit: Payer: Self-pay

## 2019-02-16 VITALS — BP 118/78 | Ht 64.0 in | Wt 250.0 lb

## 2019-02-16 DIAGNOSIS — Z0000 Encounter for general adult medical examination without abnormal findings: Secondary | ICD-10-CM | POA: Diagnosis not present

## 2019-02-18 ENCOUNTER — Encounter: Payer: Self-pay | Admitting: Advanced Practice Midwife

## 2019-02-18 NOTE — Progress Notes (Signed)
Gynecology Annual Exam   PCP: Evelene Croon, MD  Chief Complaint:  Chief Complaint  Patient presents with  . Annual Exam    History of Present Illness: Patient is a 28 y.o. G2P2001 presents for annual exam. The patient has no gyn complaints today.   LMP: Patient's last menstrual period was 02/02/2019. Average Interval: regular, 28 days Duration of flow: 5-7 days Heavy Menses: no Clots: no Intermenstrual Bleeding: no Postcoital Bleeding: no Dysmenorrhea: no  The patient is sexually active. She currently uses condoms for contraception. She denies dyspareunia.  The patient does perform self breast exams.  There is no notable family history of breast or ovarian cancer in her family.  The patient wears seatbelts: yes.   The patient has regular exercise: She was going to the gym regularly but has not been regularly exercising recently. She "eat what I want". She drinks some water and more sweet tea. She admits about 6 hours sleep per night.    The patient denies current symptoms of depression.    Review of Systems: Review of Systems  Constitutional: Negative.   HENT: Negative.   Eyes: Negative.   Respiratory: Negative.   Cardiovascular: Negative.   Gastrointestinal: Negative.   Genitourinary: Negative.   Musculoskeletal: Negative.   Skin: Negative.   Neurological: Negative.   Endo/Heme/Allergies: Negative.   Psychiatric/Behavioral: Negative.     Past Medical History:  Past Medical History:  Diagnosis Date  . Chronic low back pain without sciatica 12/21/2015  . Medical history non-contributory     Past Surgical History:  Past Surgical History:  Procedure Laterality Date  . ETHMOIDECTOMY Bilateral 05/13/2018   Procedure: ETHMOIDECTOMY;  Surgeon: Vernie Murders, MD;  Location: [facility];  Service: ENT;  Laterality: Bilateral;  . IMAGE GUIDED SINUS SURGERY Bilateral 05/13/2018   Procedure: IMAGE GUIDED SINUS SURGERY WITH PROPEL IMPLANT;  Surgeon:  Vernie Murders, MD;  Location: [facility];  Service: ENT;  Laterality: Bilateral;  stryker disk needed PROPEL IMPLANT PLACEMENT GAVE DISK TO CECE 2-28 KP  . MAXILLARY ANTROSTOMY Bilateral 05/13/2018   Procedure: MAXILLARY ANTROSTOMY removal of tissue;  Surgeon: Vernie Murders, MD;  Location: [facility];  Service: ENT;  Laterality: Bilateral;  . NASAL SINUS SURGERY Bilateral 05/13/2018   Procedure: FRONTAL SINUSOTOMY;  Surgeon: Vernie Murders, MD;  Location: [facility];  Service: ENT;  Laterality: Bilateral;  . nexplanon insertion  2012;10/21/2013;  . SPHENOIDECTOMY Bilateral 05/13/2018   Procedure: SPHENOIDECTOMY with tissue removal;  Surgeon: Vernie Murders, MD;  Location: [facility];  Service: ENT;  Laterality: Bilateral;  . TOOTH EXTRACTION      Gynecologic History:  Patient's last menstrual period was 02/02/2019. Contraception: condoms Last Pap: 1 year ago Results were: no abnormalities   Obstetric History: G2P2001  Family History:  History reviewed. No pertinent family history.  Social History:  Social History   Socioeconomic History  . Marital status: Single    Spouse name: Not on file  . Number of children: 2  . Years of education: 62  . Highest education level: Not on file  Occupational History  . Occupation: Set designer  Tobacco Use  . Smoking status: Never Smoker  . Smokeless tobacco: Never Used  Substance and Sexual Activity  . Alcohol use: No  . Drug use: No  . Sexual activity: Yes    Birth control/protection: None  Other Topics Concern  . Not on file  Social History Narrative  . Not on file   Social Determinants of  Health   Financial Resource Strain:   . Difficulty of Paying Living Expenses: Not on file  Food Insecurity:   . Worried About Charity fundraiser in the Last Year: Not on file  . Ran Out of Food in the Last Year: Not on file  Transportation Needs:   . Lack of Transportation (Medical): Not on file  . Lack of  Transportation (Non-Medical): Not on file  Physical Activity:   . Days of Exercise per Week: Not on file  . Minutes of Exercise per Session: Not on file  Stress:   . Feeling of Stress : Not on file  Social Connections:   . Frequency of Communication with Friends and Family: Not on file  . Frequency of Social Gatherings with Friends and Family: Not on file  . Attends Religious Services: Not on file  . Active Member of Clubs or Organizations: Not on file  . Attends Archivist Meetings: Not on file  . Marital Status: Not on file  Intimate Partner Violence:   . Fear of Current or Ex-Partner: Not on file  . Emotionally Abused: Not on file  . Physically Abused: Not on file  . Sexually Abused: Not on file    Allergies:  No Known Allergies  Medications: Prior to Admission medications   Medication Sig Start Date End Date Taking? Authorizing Provider  PROAIR HFA 108 [phone] Base) MCG/ACT inhaler Place 2 puffs into the nose every 6 (six) hours as needed. 06/12/17  Yes [provider]  BIOTIN PO Take by mouth daily.    [provider]  cetirizine (ZYRTEC) 10 MG tablet TAKE 1 TABLET BY MOUTH DAILY FOR ALLERGIES 11/18/18   [provider]  Cholecalciferol (VITAMIN D3) 50000 units CAPS Take 1 capsule by mouth once a week. 05/19/17   [provider]  montelukast (SINGULAIR) 10 MG tablet Take 10 mg by mouth daily. 11/18/18   [provider]  Multiple Vitamins-Minerals (MULTIVITAMIN GUMMIES WOMENS PO) Take by mouth daily.    [provider]    Physical Exam Vitals: Blood pressure 118/78, height 5\' 4"  (1.626 m), weight 250 lb (113.4 kg), last menstrual period 02/02/2019  General: NAD HEENT: normocephalic, anicteric Thyroid: no enlargement, no palpable nodules Pulmonary: No increased work of breathing, CTAB Cardiovascular: RRR, distal pulses 2+ Breast: Breast symmetrical, no tenderness, no palpable nodules or masses, no skin or nipple  retraction present, no nipple discharge.  No axillary or supraclavicular lymphadenopathy. Abdomen: NABS, soft, non-tender, non-distended.  Umbilicus without lesions.  No hepatomegaly, splenomegaly or masses palpable. No evidence of hernia  Genitourinary: deferred for no concerns Extremities: no edema, erythema, or tenderness Neurologic: Grossly intact Psychiatric: mood appropriate, affect full    Assessment: 28 y.o. G2P2001 routine annual exam  Plan: Problem List Items Addressed This Visit    None    Visit Diagnoses    Well woman exam without gynecological exam    -  Primary      1) STI screening  was offered and declined  2)  ASCCP guidelines and rationale discussed.  Patient opts for every 3 years screening interval  3) Contraception - the patient is currently using  condoms.  She is happy with her current form of contraception and plans to continue  4) Routine healthcare maintenance including cholesterol, diabetes screening discussed Declines  5) Return in about 1 year (around 02/16/2020) for annual established gyn.   Rod Can, [facility] 02/18/2019, 1:24 PM

## 2019-03-04 NOTE — L&D Delivery Note (Addendum)
Delivery Note At 2257, a viable female was delivered vaginally  (Presentation:  OA    ).  APGAR:  7,9 ; weight  .6lbs 12 oz   Placenta status:   intact, 3 vessel  Cord with the following complications:  meconium fluid seen at [facility].  Cord pH: NA Fetal head presented OA, with restitution to ROT. Nuchal cord was reduced on the perineum, and the shoulders followed easily thereafter.  The infant was placed immediately skin to skin for tactile stimulation and bonding. With stimulation, respirations were spontaneous and some initial decreased tone resolved.  Intact perineum. Anesthesia:  epidural Episiotomy:  none Lacerations:  right superficial labial laceration (no repair) Suture Repair: none Est. Blood Loss (mL):  17 cc  Mom to postpartum.  Baby to Couplet care / Skin to Skin. Both mother and baby in good condition.  [name] 01/26/2020, 11:09 PM

## 2019-03-17 ENCOUNTER — Telehealth: Payer: Self-pay | Admitting: Obstetrics & Gynecology

## 2019-03-17 NOTE — Telephone Encounter (Signed)
[facility] referring for Tubal ligation consult. Called and left voicemail for patient to call back to be schedule

## 2019-03-21 NOTE — Telephone Encounter (Signed)
Scheduled with [facility] 1/27.

## 2019-03-30 ENCOUNTER — Other Ambulatory Visit: Payer: Self-pay

## 2019-03-30 ENCOUNTER — Encounter: Payer: Self-pay | Admitting: Obstetrics & Gynecology

## 2019-03-30 ENCOUNTER — Ambulatory Visit (INDEPENDENT_AMBULATORY_CARE_PROVIDER_SITE_OTHER): Payer: Medicaid Other | Admitting: Obstetrics & Gynecology

## 2019-03-30 VITALS — BP 120/80 | Ht 64.0 in | Wt 248.0 lb

## 2019-03-30 DIAGNOSIS — Z3009 Encounter for other general counseling and advice on contraception: Secondary | ICD-10-CM

## 2019-03-30 NOTE — Patient Instructions (Signed)
Surgery to Prevent Pregnancy Female sterilization is surgery to prevent pregnancy. In this surgery, the fallopian tubes are either blocked or closed off. When the fallopian tubes are closed, the eggs that the ovaries release cannot enter the uterus, sperm cannot reach the eggs, and you cannot get pregnant. Sterilization is permanent. It should only be done if you are sure that you do not want to be able to have children. What are the sterilization surgery options? There are several kinds of female sterilization surgeries. They include:  Laparoscopic tubal ligation. In this surgery, the fallopian tubes are tied off, sealed with heat, or blocked with a clip, ring, or clamp. A small portion of each fallopian tube may also be removed. This surgery is done through several small cuts (incisions) with special instruments that are inserted into your abdomen.  Postpartum tubal ligation. This is also called a mini-laparotomy. This surgery is done right after childbirth or 1 or 2 days after childbirth. In this surgery, the fallopian tubes are tied off, sealed with heat, or blocked with a clip, ring, or clamp. A small portion of each fallopian tube may also be removed. The surgery is done through a single incision in the abdomen.  Tubal ligation during a C-section. In this surgery, the fallopian tubes are tied off, sealed with heat, or blocked with a clip, ring, or clamp. A small portion of each fallopian tube may also be removed. The surgery is done at the same time as a C-section delivery. Is sterilization safe? Generally, sterilization is safe. Complications are rare. However, there are risks. They include:  Bleeding.  Infection.  Reaction to medicine used during the procedure.  Injury to surrounding organs.  Failure of the procedure. How effective is sterilization? Sterilization is nearly 100% effective, but it can fail. In rare cases, the fallopian tubes can grow back together over time. If this  happens, pregnancy may be possible and you will be able to get pregnant again. Women who have had this procedure have a higher chance of having an ectopic pregnancy. An ectopic pregnancy is a pregnancy that happens outside of the uterus. This kind of pregnancy can lead to serious bleeding if it is not treated. What are the benefits?  It is usually effective for a lifetime.  It is usually safe.  It does not have the drawbacks of other types of birth control in that your hormones are not affected. Because of this, your menstrual periods, sexual desire, and sexual performance will not be affected. What are the drawbacks?  You will need to recover and may have complications after surgery.  If you change your mind and decide that you want to have children, you may not be able to. Sterilization may be reversed, but a reversal is not always successful.  It does not provide protection against STDs (sexually transmitted diseases).  It increases the chance of having an ectopic pregnancy. Follow these instructions at home:  Keep all follow-up visits as told by your health care provider. This is important. Summary  Female sterilization is surgery to prevent pregnancy.  There are different types of female sterilization surgeries.  Sterilization may be reversed, but a reversal is not always successful.  Sterilization does not protect against STDs. This information is not intended to replace advice given to you by your health care provider. Make sure you discuss any questions you have with your health care provider. Document Revised: 08/04/2018 Document Reviewed: 10/30/2017 Elsevier Patient Education  2020 Elsevier Inc.  

## 2019-03-30 NOTE — Progress Notes (Signed)
Consultant: Dr Deatra Ina Reason: Contraception Counseling and surgery  Contraception Counseling Patient presents for contraception counseling. The patient is a 29 yo G2P2 AA F with 2 prior NSVD, last one 5 years ago, and no desire for future fertility.  Has used Nexplanon but did not like it any longer and had it removed.  She desires no other form of hormonal BC and does not want Paraguard IUD.  She  has no complaints today. The patient is sexually active. Pertinent past medical history: none.   PMHx: She  has a past medical history of Chronic low back pain without sciatica (12/21/2015) and Medical history non-contributory. Also,  has a past surgical history that includes nexplanon insertion (2012;10/21/2013;); Tooth extraction; Image guided sinus surgery (Bilateral, 05/13/2018); Maxillary antrostomy (Bilateral, 05/13/2018); Nasal sinus surgery (Bilateral, 05/13/2018); Ethmoidectomy (Bilateral, 05/13/2018); and Sphenoidectomy (Bilateral, 05/13/2018)., family history is not on file.,  reports that she has never smoked. She has never used smokeless tobacco. She reports that she does not drink alcohol or use drugs.  She has a current medication list which includes the following prescription(s): biotin, cetirizine, vitamin d3, montelukast, multiple vitamins-minerals, and proair hfa. Also, has No Known Allergies.  Review of Systems  Constitutional: Negative for chills, fever and malaise/fatigue.  HENT: Negative for congestion, sinus pain and sore throat.   Eyes: Negative for blurred vision and pain.  Respiratory: Negative for cough and wheezing.   Cardiovascular: Negative for chest pain and leg swelling.  Gastrointestinal: Negative for abdominal pain, constipation, diarrhea, heartburn, nausea and vomiting.  Genitourinary: Negative for dysuria, frequency, hematuria and urgency.  Musculoskeletal: Negative for back pain, joint pain, myalgias and neck pain.  Skin: Negative for itching and rash.    Neurological: Negative for dizziness, tremors and weakness.  Endo/Heme/Allergies: Does not bruise/bleed easily.  Psychiatric/Behavioral: Negative for depression. The patient is not nervous/anxious and does not have insomnia.     Objective: BP 120/80   Ht 5\' 4"  (1.626 m)   Wt 248 lb (112.5 kg)   LMP 03/25/2019 (Exact Date)   Breastfeeding No   BMI 42.57 kg/m  Physical Exam Constitutional:      General: She is not in acute distress.    Appearance: She is well-developed.  Musculoskeletal:        General: Normal range of motion.  Neurological:     Mental Status: She is alert and oriented to person, place, and time.  Skin:    General: Skin is warm and dry.  Vitals reviewed.     ASSESSMENT/PLAN:   Problem List Items Addressed This Visit    Sterilization consult    -  Primary    All options discussed, pt prefers tubal ligation for permanent sterility 30 day papers signed today for consent  The patient has been fully informed about all methods of contraception, both temporary and permanent. She understands that tubal ligation is meant to be permanent, absolute and irreversible. She was told that there is an approximately 1 in 400 chance of a pregnancy in the future after tubal ligation. She was told the short and long term complications of tubal ligation. She understands the risks from this surgery include, but are not limited to, the risks of anesthesia, hemorrhage, infection, perforation, and injury to adjacent structures, bowel, bladder and blood vessels.   A total of 30 minutes were spent face-to-face with the patient as well as preparation, review, communication, and documentation during this encounter.   03/27/2019, MD, Annamarie Major Ob/Gyn, [facility] 03/30/2019  2:33 PM

## 2019-04-06 ENCOUNTER — Telehealth: Payer: Self-pay | Admitting: Obstetrics & Gynecology

## 2019-04-06 NOTE — Telephone Encounter (Signed)
-----   Message from Nadara Mustard, MD sent at 03/30/2019  2:41 PM EST ----- Regarding: Surgery Surgery Booking Request Patient Full Name:  Abigail Mejia  MRN: [id number]  DOB: 1990/10/23  Surgeon: Letitia Libra, MD  Requested Surgery Date and Time: >30 days from today for tubal consent papers to allow time Primary Diagnosis AND Code: Desire for sterility Secondary Diagnosis and Code: Z30.09 Surgical Procedure: Laparoscopy with Tubal Partial Salingectomy L&D Notification: No Admission Status: same day surgery Length of Surgery: 30 min Special Case Needs: No H&P: Yes Phone Interview???:  Yes Interpreter: No Language:  Medical Clearance:  No Special Scheduling Instructions: No Any known health/anesthesia issues, diabetes, sleep apnea, latex allergy, defibrillator/pacemaker?: No Acuity: P3   (P1 highest, P2 delay may cause harm, P3 low, elective gyn, P4 lowest) Priority 3

## 2019-04-06 NOTE — Telephone Encounter (Signed)
Lmtrc

## 2019-04-12 NOTE — Telephone Encounter (Signed)
Patient is aware of H&P at [facility] on 3/5 @ 2:50pm w/ Dr. Tiburcio Pea, Pre-admit testing to be scheduled, COVID testing on 3/16, and OR on 05/19/19. Patient is aware to quarantine after COVID testing.

## 2019-05-06 ENCOUNTER — Ambulatory Visit (INDEPENDENT_AMBULATORY_CARE_PROVIDER_SITE_OTHER): Payer: Medicaid Other | Admitting: Obstetrics & Gynecology

## 2019-05-06 ENCOUNTER — Encounter: Payer: Self-pay | Admitting: Obstetrics & Gynecology

## 2019-05-06 ENCOUNTER — Other Ambulatory Visit: Payer: Self-pay

## 2019-05-06 VITALS — BP 100/70 | Ht 64.0 in | Wt 253.0 lb

## 2019-05-06 DIAGNOSIS — Z01818 Encounter for other preprocedural examination: Secondary | ICD-10-CM

## 2019-05-06 DIAGNOSIS — Z3009 Encounter for other general counseling and advice on contraception: Secondary | ICD-10-CM | POA: Diagnosis not present

## 2019-05-06 DIAGNOSIS — Z302 Encounter for sterilization: Secondary | ICD-10-CM | POA: Diagnosis not present

## 2019-05-06 NOTE — Patient Instructions (Signed)
Laparoscopic Tubal Ligation, Care After This sheet gives you information about how to care for yourself after your procedure. Your health care provider may also give you more specific instructions. If you have problems or questions, contact your health care provider. What can I expect after the procedure? After the procedure, it is common to have:  A sore throat.  Discomfort in your shoulder.  Mild discomfort or cramping in your abdomen.  Gas pains.  Pain or soreness in the area where the surgical incision was made.  A bloated feeling.  Tiredness.  Nausea.  Vomiting. Follow these instructions at home: Medicines  Take over-the-counter and prescription medicines only as told by your health care provider.  Do not take aspirin because it can cause bleeding.  Ask your health care provider if the medicine prescribed to you: ? Requires you to avoid driving or using heavy machinery. ? Can cause constipation. You may need to take actions to prevent or treat constipation, such as:  Drink enough fluid to keep your urine pale yellow.  Take over-the-counter or prescription medicines.  Eat foods that are high in fiber, such as beans, whole grains, and fresh fruits and vegetables.  Limit foods that are high in fat and processed sugars, such as fried or sweet foods. Incision care      Follow instructions from your health care provider about how to take care of your incision. Make sure you: ? Wash your hands with soap and water before and after you change your bandage (dressing). If soap and water are not available, use hand sanitizer. ? Change your dressing as told by your health care provider. ? Leave stitches (sutures), skin glue, or adhesive strips in place. These skin closures may need to stay in place for 2 weeks or longer. If adhesive strip edges start to loosen and curl up, you may trim the loose edges. Do not remove adhesive strips completely unless your health care provider  tells you to do that.  Check your incision area every day for signs of infection. Check for: ? Redness, swelling, or pain. ? Fluid or blood. ? Warmth. ? Pus or a bad smell. Activity  Rest as told by your health care provider.  Avoid sitting for a long time without moving. Get up to take short walks every 1-2 hours. This is important to improve blood flow and breathing. Ask for help if you feel weak or unsteady.  Return to your normal activities as told by your health care provider. Ask your health care provider what activities are safe for you. General instructions  Do not take baths, swim, or use a hot tub until your health care provider approves. Ask your health care provider if you may take showers. You may only be allowed to take sponge baths.  Have someone help you with your daily household tasks for the first few days.  Keep all follow-up visits as told by your health care provider. This is important. Contact a health care provider if:  You have redness, swelling, or pain around your incision.  Your incision feels warm to the touch.  You have pus or a bad smell coming from your incision.  The edges of your incision break open after the sutures have been removed.  Your pain does not improve after 2-3 days.  You have a rash.  You repeatedly become dizzy or light-headed.  Your pain medicine is not helping. Get help right away if you:  Have a fever.  Faint.  Have increasing   pain in your abdomen.  Have severe pain in one or both of your shoulders.  Have fluid or blood coming from your sutures or from your vagina.  Have shortness of breath or difficulty breathing.  Have chest pain or leg pain.  Have ongoing nausea, vomiting, or diarrhea. Summary  After the procedure, it is common to have mild discomfort or cramping in your abdomen.  Take over-the-counter and prescription medicines only as told by your health care provider.  Watch for symptoms that should  prompt you to call your health care provider.  Keep all follow-up visits as told by your health care provider. This is important. This information is not intended to replace advice given to you by your health care provider. Make sure you discuss any questions you have with your health care provider. Document Revised: 07/27/2018 Document Reviewed: 01/12/2018 Elsevier Patient Education  2020 Elsevier Inc.  

## 2019-05-06 NOTE — Progress Notes (Signed)
PRE-OPERATIVE HISTORY AND PHYSICAL EXAM  HPI:  Abigail D Enyeart is a 29 y.o. [id number] Patient's last menstrual period was 04/21/2019.; she is being admitted for surgery related to requested sterilization. The patient is a 29 yo G2P2 AA F with 2 prior NSVD, last one 5 years ago, and no desire for future fertility.  Has used Nexplanon but did not like it any longer and had it removed.  She desires no other form of hormonal BC and does not want Paraguard IUD.  She  has no complaints today. The patient is sexually active. Pertinent past medical history: none.  PMHx: Past Medical History:  Diagnosis Date  . Chronic low back pain without sciatica 12/21/2015  . Medical history non-contributory    Past Surgical History:  Procedure Laterality Date  . ETHMOIDECTOMY Bilateral 05/13/2018   Procedure: ETHMOIDECTOMY;  Surgeon: Vernie Murders, MD;  Location: [facility];  Service: ENT;  Laterality: Bilateral;  . IMAGE GUIDED SINUS SURGERY Bilateral 05/13/2018   Procedure: IMAGE GUIDED SINUS SURGERY WITH PROPEL IMPLANT;  Surgeon: Vernie Murders, MD;  Location: [facility];  Service: ENT;  Laterality: Bilateral;  stryker disk needed PROPEL IMPLANT PLACEMENT GAVE DISK TO CECE 2-28 KP  . MAXILLARY ANTROSTOMY Bilateral 05/13/2018   Procedure: MAXILLARY ANTROSTOMY removal of tissue;  Surgeon: Vernie Murders, MD;  Location: [facility];  Service: ENT;  Laterality: Bilateral;  . NASAL SINUS SURGERY Bilateral 05/13/2018   Procedure: FRONTAL SINUSOTOMY;  Surgeon: Vernie Murders, MD;  Location: [facility];  Service: ENT;  Laterality: Bilateral;  . nexplanon insertion  2012;10/21/2013;  . SPHENOIDECTOMY Bilateral 05/13/2018   Procedure: SPHENOIDECTOMY with tissue removal;  Surgeon: Vernie Murders, MD;  Location: [facility];  Service: ENT;  Laterality: Bilateral;  . TOOTH EXTRACTION     History reviewed. No pertinent family history. Social History   Tobacco Use  . Smoking status:  Never Smoker  . Smokeless tobacco: Never Used  Substance Use Topics  . Alcohol use: No  . Drug use: No    Current Outpatient Medications:  .  cetirizine (ZYRTEC) 10 MG tablet, Take 10 mg by mouth daily. , Disp: , Rfl:  .  montelukast (SINGULAIR) 10 MG tablet, Take 10 mg by mouth daily., Disp: , Rfl:  .  PROAIR HFA 108 (90 Base) MCG/ACT inhaler, Place 2 puffs into the nose every 6 (six) hours as needed for wheezing or shortness of breath. , Disp: , Rfl: 5 Allergies: Patient has no known allergies.  Review of Systems  Constitutional: Negative for chills, fever and malaise/fatigue.  HENT: Negative for congestion, sinus pain and sore throat.   Eyes: Negative for blurred vision and pain.  Respiratory: Negative for cough and wheezing.   Cardiovascular: Negative for chest pain and leg swelling.  Gastrointestinal: Negative for abdominal pain, constipation, diarrhea, heartburn, nausea and vomiting.  Genitourinary: Negative for dysuria, frequency, hematuria and urgency.  Musculoskeletal: Negative for back pain, joint pain, myalgias and neck pain.  Skin: Negative for itching and rash.  Neurological: Negative for dizziness, tremors and weakness.  Endo/Heme/Allergies: Does not bruise/bleed easily.  Psychiatric/Behavioral: Negative for depression. The patient is not nervous/anxious and does not have insomnia.     Objective: BP 100/70   Ht 5\' 4"  (1.626 m)   Wt 253 lb (114.8 kg)   LMP 04/21/2019   BMI 43.43 kg/m   Filed Weights   05/06/19 1413  Weight: 253 lb (114.8 kg)   Physical Exam Constitutional:  General: She is not in acute distress.    Appearance: She is well-developed.  HENT:     Head: Normocephalic and atraumatic. No laceration.     Right Ear: Hearing normal.     Left Ear: Hearing normal.     Mouth/Throat:     Pharynx: Uvula midline.  Eyes:     Pupils: Pupils are equal, round, and reactive to light.  Neck:     Thyroid: No thyromegaly.  Cardiovascular:     Rate and  Rhythm: Normal rate and regular rhythm.     Heart sounds: No murmur. No friction rub. No gallop.   Pulmonary:     Effort: Pulmonary effort is normal. No respiratory distress.     Breath sounds: Normal breath sounds. No wheezing.  Chest:     Breasts:        Right: No mass, skin change or tenderness.        Left: No mass, skin change or tenderness.  Abdominal:     General: Bowel sounds are normal. There is no distension.     Palpations: Abdomen is soft.     Tenderness: There is no abdominal tenderness. There is no rebound.  Musculoskeletal:        General: Normal range of motion.     Cervical back: Normal range of motion and neck supple.  Neurological:     Mental Status: She is alert and oriented to person, place, and time.     Cranial Nerves: No cranial nerve deficit.  Skin:    General: Skin is warm and dry.  Psychiatric:        Judgment: Judgment normal.  Vitals reviewed.    Assessment: 1. Sterilization consult   Plan Laparoscopy, partial salpingectomy  The patient has been fully informed about all methods of contraception, both temporary and permanent. She understands that tubal ligation is meant to be permanent, absolute and irreversible. She was told that there is an approximately 1 in 400 chance of a pregnancy in the future after tubal ligation. She was told the short and long term complications of tubal ligation. She understands the risks from this surgery include, but are not limited to, the risks of anesthesia, hemorrhage, infection, perforation, and injury to adjacent structures, bowel, bladder and blood vessels.   Barnett Applebaum, MD, [facility], [facility] 05/06/2019  2:59 PM

## 2019-05-09 ENCOUNTER — Other Ambulatory Visit: Payer: Self-pay

## 2019-05-09 ENCOUNTER — Encounter
Admission: RE | Admit: 2019-05-09 | Discharge: 2019-05-09 | Disposition: A | Discharge status: Home / Self Care | Payer: Medicaid Other | Source: Ambulatory Visit | Attending: Obstetrics & Gynecology | Admitting: Obstetrics & Gynecology

## 2019-05-09 NOTE — Patient Instructions (Addendum)
Your procedure is scheduled on: Thursday, March 18 Report to Day Surgery on the 2nd floor of the [facility] To find out your arrival time, please call [phone] between 1PM - 3PM on: Wednesday, March 17  REMEMBER: Instructions that are not followed completely may result in serious medical risk, up to and including death; or upon the discretion of your surgeon and anesthesiologist your surgery may need to be rescheduled.  Do not eat food after midnight the night before surgery.  No gum chewing, lozengers or hard candies.  You may however, drink CLEAR liquids up to 2 hours before you are scheduled to arrive for your surgery. Do not drink anything within 2 hours of the start of your surgery.  Clear liquids include: - water  - apple juice without pulp - gatorade (not RED) - black coffee or tea (Do NOT add milk or creamers to the coffee or tea) Do NOT drink anything that is not on this list.  TAKE THESE MEDICATIONS THE MORNING OF SURGERY WITH A SIP OF WATER:  1.  ProAir inhaler  Use inhalers on the day of surgery and bring to the hospital.  Stop Anti-inflammatories (NSAIDS) such as Advil, Aleve, Ibuprofen, Motrin, Naproxen, Naprosyn and Aspirin based products such as Excedrin, Goodys Powder, BC Powder. (May take Tylenol or Acetaminophen if needed.)  Stop ANY OVER THE COUNTER supplements until after surgery.  No Alcohol for 24 hours before or after surgery.  No Smoking including e-cigarettes for 24 hours prior to surgery.  No chewable tobacco products for at least 6 hours prior to surgery.  No nicotine patches on the day of surgery.  On the morning of surgery brush your teeth with toothpaste and water, you may rinse your mouth with mouthwash if you wish. Do not swallow any toothpaste or mouthwash.  Do not wear jewelry, make-up, hairpins, clips or nail polish.  Do not wear lotions, powders, or perfumes.   Do not shave 48 hours prior to surgery.   Contact lenses, hearing  aids and dentures may not be worn into surgery.  Do not bring valuables to the hospital, including drivers license, insurance or credit cards.  [facility] is not responsible for any belongings or valuables.   Use CHG Soap as directed on instruction sheet.  Notify your doctor if there is any change in your medical condition (cold, fever, infection).  Wear comfortable clothing (specific to your surgery type) to the hospital.  If you are being discharged the day of surgery, you will not be allowed to drive home. You will need a responsible adult to drive you home and stay with you that night.   If you are taking public transportation, you will need to have a responsible adult with you. Please confirm with your physician that it is acceptable to use public transportation.   Please call [phone] if you have any questions about these instructions.  Visitation Policy:  Patients undergoing a surgery or procedure in a hospital may have one family member or support person with them as long as that person is not COVID-19 positive or experiencing its symptoms. That person may remain in the waiting area during the procedure. Should the patient need to stay at the hospital during part of their recovery, the support person may visit during visiting hours; 10 am to 8 pm.

## 2019-05-17 ENCOUNTER — Other Ambulatory Visit
Admission: RE | Admit: 2019-05-17 | Discharge: 2019-05-17 | Disposition: A | Discharge status: Home / Self Care | Payer: Medicaid Other | Source: Ambulatory Visit | Attending: Obstetrics & Gynecology | Admitting: Obstetrics & Gynecology

## 2019-05-17 DIAGNOSIS — Z01812 Encounter for preprocedural laboratory examination: Secondary | ICD-10-CM | POA: Insufficient documentation

## 2019-05-17 DIAGNOSIS — Z20822 Contact with and (suspected) exposure to covid-19: Secondary | ICD-10-CM | POA: Insufficient documentation

## 2019-05-17 LAB — BASIC METABOLIC PANEL
Anion gap: 7 (ref 5–15)
BUN: 8 mg/dL (ref 6–20)
CO2: 24 mmol/L (ref 22–32)
Calcium: 8.9 mg/dL (ref 8.9–10.3)
Chloride: 107 mmol/L (ref 98–111)
Creatinine, Ser: 0.63 mg/dL (ref 0.44–1.00)
GFR calc Af Amer: >60 mL/min (ref >60)
GFR calc non Af Amer: >60 mL/min (ref >60)
Glucose, Bld: 86 mg/dL (ref 70–99)
Potassium: 3.7 mmol/L (ref 3.5–5.1)
Sodium: 138 mmol/L (ref 135–145)

## 2019-05-17 LAB — CBC
HCT: 38.6 % (ref 36.0–46.0)
Hemoglobin: 13.4 g/dL (ref 12.0–15.0)
MCH: 31.7 pg (ref 26.0–34.0)
MCHC: 34.7 g/dL (ref 30.0–36.0)
MCV: 91.3 fL (ref 80.0–100.0)
Platelets: 192 10*3/uL (ref 150–400)
RBC: 4.23 MIL/uL (ref 3.87–5.11)
RDW: 12 % (ref 11.5–15.5)
WBC: 4.3 10*3/uL (ref 4.0–10.5)
nRBC: 0 % (ref 0.0–0.2)

## 2019-05-17 LAB — TYPE AND SCREEN
ABO/RH(D): O POS
Antibody Screen: NEGATIVE

## 2019-05-17 LAB — SARS CORONAVIRUS 2 (TAT 6-24 HRS): SARS Coronavirus 2: NEGATIVE

## 2019-05-18 ENCOUNTER — Telehealth: Payer: Self-pay | Admitting: Obstetrics & Gynecology

## 2019-05-18 ENCOUNTER — Other Ambulatory Visit: Payer: Self-pay | Admitting: Obstetrics & Gynecology

## 2019-05-18 DIAGNOSIS — N926 Irregular menstruation, unspecified: Secondary | ICD-10-CM

## 2019-05-18 NOTE — Telephone Encounter (Addendum)
Pt called to inform us she is pregnant. Was scheduled for tubal ligation tomorrow, 3/18, with Dr Tiburcio Pea.   I canceled her post op appt per Harriett Sine and canceled her 3/18 procedure per Dr Tiburcio Pea.  Dr Tiburcio Pea req that she be scheduled for Beta lab. Transferred pt to [place] Dec to schedule.  According to pt, LMP 04/20/19

## 2019-05-18 NOTE — Telephone Encounter (Signed)
Patient is schedule for 05/24/19 for Beta lab. Please place order. Thank you

## 2019-05-19 ENCOUNTER — Ambulatory Visit
Admission: RE | Admit: 2019-05-19 | Payer: Medicaid Other | Source: Home / Self Care | Admitting: Obstetrics & Gynecology

## 2019-05-19 ENCOUNTER — Encounter: Admission: RE | Payer: Self-pay | Source: Home / Self Care

## 2019-05-19 SURGERY — LIGATION, FALLOPIAN TUBE, LAPAROSCOPIC
Anesthesia: Choice

## 2019-05-24 ENCOUNTER — Other Ambulatory Visit: Payer: Self-pay

## 2019-05-24 ENCOUNTER — Other Ambulatory Visit: Payer: Medicaid Other

## 2019-05-24 DIAGNOSIS — N926 Irregular menstruation, unspecified: Secondary | ICD-10-CM

## 2019-05-25 LAB — BETA HCG QUANT (REF LAB): hCG Quant: 634 m[IU]/mL

## 2019-05-27 ENCOUNTER — Ambulatory Visit: Payer: Medicaid Other | Admitting: Obstetrics & Gynecology

## 2019-06-07 ENCOUNTER — Encounter: Payer: Self-pay | Admitting: Advanced Practice Midwife

## 2019-06-07 ENCOUNTER — Other Ambulatory Visit (HOSPITAL_COMMUNITY)
Admission: RE | Admit: 2019-06-07 | Discharge: 2019-06-07 | Disposition: A | Discharge status: Home / Self Care | Payer: Medicaid Other | Source: Ambulatory Visit | Attending: Advanced Practice Midwife | Admitting: Advanced Practice Midwife

## 2019-06-07 ENCOUNTER — Other Ambulatory Visit: Payer: Self-pay

## 2019-06-07 ENCOUNTER — Ambulatory Visit (INDEPENDENT_AMBULATORY_CARE_PROVIDER_SITE_OTHER): Payer: Medicaid Other | Admitting: Advanced Practice Midwife

## 2019-06-07 VITALS — BP 122/78 | Wt 255.0 lb

## 2019-06-07 DIAGNOSIS — O0990 Supervision of high risk pregnancy, unspecified, unspecified trimester: Secondary | ICD-10-CM | POA: Insufficient documentation

## 2019-06-07 DIAGNOSIS — O99210 Obesity complicating pregnancy, unspecified trimester: Secondary | ICD-10-CM | POA: Insufficient documentation

## 2019-06-07 DIAGNOSIS — Z6841 Body Mass Index (BMI) 40.0 and over, adult: Secondary | ICD-10-CM | POA: Insufficient documentation

## 2019-06-07 DIAGNOSIS — Z113 Encounter for screening for infections with a predominantly sexual mode of transmission: Secondary | ICD-10-CM

## 2019-06-07 HISTORY — DX: Supervision of high risk pregnancy, unspecified, unspecified trimester: O09.90

## 2019-06-07 NOTE — Progress Notes (Signed)
NOB. LMP 04/22/2019

## 2019-06-07 NOTE — Patient Instructions (Addendum)
Exercise During Pregnancy Exercise is an important part of being healthy for people of all ages. Exercise improves the function of your heart and lungs and helps you maintain strength, flexibility, and a healthy body weight. Exercise also boosts energy levels and elevates mood. Most women should exercise regularly during pregnancy. In rare cases, women with certain medical conditions or complications may be asked to limit or avoid exercise during pregnancy. How does this affect me? Along with maintaining general strength and flexibility, exercising during pregnancy can help:  Keep strength in muscles that are used during labor and childbirth.  Decrease low back pain.  Reduce symptoms of depression.  Control weight gain during pregnancy.  Reduce the risk of needing insulin if you develop diabetes during pregnancy.  Decrease the risk of cesarean delivery.  Speed up your recovery after giving birth. How does this affect my baby? Exercise can help you have a healthy pregnancy. Exercise does not cause premature birth. It will not cause your baby to weigh less at birth. What exercises can I do? Many exercises are safe for you to do during pregnancy. Do a variety of exercises that safely increase your heart and breathing rates and help you build and maintain muscle strength. Do exercises exactly as told by your health care provider. You may do these exercises:  Walking or hiking.  Swimming.  Water aerobics.  Riding a stationary bike.  Strength training.  Modified yoga or Pilates. Tell your instructor that you are pregnant. Avoid overstretching, and avoid lying on your back for long periods of time.  Running or jogging. Only choose this type of exercise if you: ? Ran or jogged regularly before your pregnancy. ? Can run or jog and still talk in complete sentences. What exercises should I avoid? Depending on your level of fitness and whether you exercised regularly before your  pregnancy, you may be told to limit high-intensity exercise. You can tell that you are exercising at a high intensity if you are breathing much harder and faster and cannot hold a conversation while exercising. You must avoid:  Contact sports.  Activities that put you at risk for falling on or being hit in the belly, such as downhill skiing, water skiing, surfing, rock climbing, cycling, gymnastics, and horseback riding.  Scuba diving.  Skydiving.  Yoga or Pilates in a room that is heated to high temperatures.  Jogging or running, unless you ran or jogged regularly before your pregnancy. While jogging or running, you should always be able to talk in full sentences. Do not run or jog so fast that you are unable to have a conversation.  Do not exercise at more than 6,000 feet above sea level (high elevation) if you are not used to exercising at high elevation. How do I exercise in a safe way?   Avoid overheating. Do not exercise in very high temperatures.  Wear loose-fitting, breathable clothes.  Avoid dehydration. Drink enough water before, during, and after exercise to keep your urine pale yellow.  Avoid overstretching. Because of hormone changes during pregnancy, it is easy to overstretch muscles, tendons, and ligaments during pregnancy.  Start slowly and ask your health care provider to recommend the types of exercise that are safe for you.  Do not exercise to lose weight. Follow these instructions at home:  Exercise on most days or all days of the week. Try to exercise for 30 minutes a day, 5 days a week, unless your health care provider tells you not to.  If   you actively exercised before your pregnancy and you are healthy, your health care provider may tell you to continue to do moderate to high-intensity exercise.  If you are just starting to exercise or did not exercise much before your pregnancy, your health care provider may tell you to do low to moderate-intensity  exercise. Questions to ask your health care provider  Is exercise safe for me?  What are signs that I should stop exercising?  Does my health condition mean that I should not exercise during pregnancy?  When should I avoid exercising during pregnancy? Stop exercising and contact a health care provider if: You have any unusual symptoms, such as:  Mild contractions of the uterus or cramps in the abdomen.  Dizziness that does not go away when you rest. Stop exercising and get help right away if: You have any unusual symptoms, such as:  Sudden, severe pain in your low back or your belly.  Mild contractions of the uterus or cramps in the abdomen that do not improve with rest and drinking fluids.  Chest pain.  Bleeding or fluid leaking from your vagina.  Shortness of breath. These symptoms may represent a serious problem that is an emergency. Do not wait to see if the symptoms will go away. Get medical help right away. Call your local emergency services (911 in the [place] Do not drive yourself to the hospital. Summary  Most women should exercise regularly throughout pregnancy. In rare cases, women with certain medical conditions or complications may be asked to limit or avoid exercise during pregnancy.  Do not exercise to lose weight during pregnancy.  Your health care provider will tell you what level of physical activity is right for you.  Stop exercising and contact a health care provider if you have mild contractions of the uterus or cramps in the abdomen. Get help right away if these contractions or cramps do not improve with rest and drinking fluids.  Stop exercising and get help right away if you have sudden, severe pain in your low back or belly, chest pain, shortness of breath, or bleeding or leaking of fluid from your vagina. This information is not intended to replace advice given to you by your health care provider. Make sure you discuss any questions you have with your  health care provider. Document Revised: 06/10/2018 Document Reviewed: 03/24/2018 Elsevier Patient Education  2020 [employer] Eating Plan for Pregnant Women While you are pregnant, your body requires additional nutrition to help support your growing baby. You also have a higher need for some vitamins and minerals, such as folic acid, calcium, iron, and vitamin D. Eating a healthy, well-balanced diet is very important for your health and your baby's health. Your need for extra calories varies for the three 3-month segments of your pregnancy (trimesters). For most women, it is recommended to consume:  150 extra calories a day during the first trimester.  300 extra calories a day during the second trimester.  300 extra calories a day during the third trimester. What are tips for following this plan?   Do not try to lose weight or go on a diet during pregnancy.  Limit your overall intake of foods that have "empty calories." These are foods that have little nutritional value, such as sweets, desserts, candies, and sugar-sweetened beverages.  Eat a variety of foods (especially fruits and vegetables) to get a full range of vitamins and minerals.  Take a prenatal vitamin to help meet your additional vitamin and mineral needs   during pregnancy, specifically for folic acid, iron, calcium, and vitamin D.  Remember to stay active. Ask your health care provider what types of exercise and activities are safe for you.  Practice good food safety and cleanliness. Wash your hands before you eat and after you prepare raw meat. Wash all fruits and vegetables well before peeling or eating. Taking these actions can help to prevent food-borne illnesses that can be very dangerous to your baby, such as listeriosis. Ask your health care provider for more information about listeriosis. What does 150 extra calories look like? Healthy options that provide 150 extra calories each day could be any of the  following:  6-8 oz (170-230 g) of plain low-fat yogurt with  cup of berries.  1 apple with 2 teaspoons (11 g) of peanut butter.  Cut-up vegetables with  cup (60 g) of hummus.  8 oz (230 mL) or 1 cup of low-fat chocolate milk.  1 stick of string cheese with 1 medium orange.  1 peanut butter and jelly sandwich that is made with one slice of whole-wheat bread and 1 tsp (5 g) of peanut butter. For 300 extra calories, you could eat two of those healthy options each day. What is a healthy amount of weight to gain? The right amount of weight gain for you is based on your BMI before you became pregnant. If your BMI:  Was less than 18 (underweight), you should gain 28-40 lb (13-18 kg).  Was 18-24.9 (normal), you should gain 25-35 lb (11-16 kg).  Was 25-29.9 (overweight), you should gain 15-25 lb (7-11 kg).  Was 30 or greater (obese), you should gain 11-20 lb (5-9 kg). What if I am having twins or multiples? Generally, if you are carrying twins or multiples:  You may need to eat 300-600 extra calories a day.  The recommended range for total weight gain is 25-54 lb (11-25 kg), depending on your BMI before pregnancy.  Talk with your health care provider to find out about nutritional needs, weight gain, and exercise that is right for you. What foods can I eat?  Fruits All fruits. Eat a variety of colors and types of fruit. Remember to wash your fruits well before peeling or eating. Vegetables All vegetables. Eat a variety of colors and types of vegetables. Remember to wash your vegetables well before peeling or eating. Grains All grains. Choose whole grains, such as whole-wheat bread, oatmeal, or brown rice. Meats and other protein foods Lean meats, including chicken, turkey, fish, and lean cuts of beef, veal, or pork. If you eat fish or seafood, choose options that are higher in omega-3 fatty acids and lower in mercury, such as salmon, herring, mussels, trout, sardines, pollock,  shrimp, crab, and lobster. Tofu. Tempeh. Beans. Eggs. Peanut butter and other nut butters. Make sure that all meats, poultry, and eggs are cooked to food-safe temperatures or "well-done." Two or more servings of fish are recommended each week in order to get the most benefits from omega-3 fatty acids that are found in seafood. Choose fish that are lower in mercury. You can find more information online:  www.fda.gov Dairy Pasteurized milk and milk alternatives (such as almond milk). Pasteurized yogurt and pasteurized cheese. Cottage cheese. Sour cream. Beverages Water. Juices that contain 100% fruit juice or vegetable juice. Caffeine-free teas and decaffeinated coffee. Drinks that contain caffeine are okay to drink, but it is better to avoid caffeine. Keep your total caffeine intake to less than 200 mg each day (which is 12 oz   or 355 mL of coffee, tea, or soda) or the limit as told by your health care provider. Fats and oils Fats and oils are okay to include in moderation. Sweets and desserts Sweets and desserts are okay to include in moderation. Seasoning and other foods All pasteurized condiments. The items listed above may not be a complete list of foods and beverages you can eat. Contact a dietitian for more information. What foods are not recommended? Fruits Unpasteurized fruit juices. Vegetables Raw (unpasteurized) vegetable juices. Meats and other protein foods Lunch meats, bologna, hot dogs, or other deli meats. (If you must eat those meats, reheat them until they are steaming hot.) Refrigerated pat, meat spreads from a meat counter, smoked seafood that is found in the refrigerated section of a store. Raw or undercooked meats, poultry, and eggs. Raw fish, such as sushi or sashimi. Fish that have high mercury content, such as tilefish, shark, swordfish, and king mackerel. To learn more about mercury in fish, talk with your health care provider or look for online resources, such  as:  www.fda.gov Dairy Raw (unpasteurized) milk and any foods that have raw milk in them. Soft cheeses, such as feta, queso blanco, queso fresco, Brie, Camembert cheeses, blue-veined cheeses, and Panela cheese (unless it is made with pasteurized milk, which must be stated on the label). Beverages Alcohol. Sugar-sweetened beverages, such as sodas, teas, or energy drinks. Seasoning and other foods Homemade fermented foods and drinks, such as pickles, sauerkraut, or kombucha drinks. (Store-bought pasteurized versions of these are okay.) Salads that are made in a store or deli, such as ham salad, chicken salad, egg salad, tuna salad, and seafood salad. The items listed above may not be a complete list of foods and beverages you should avoid. Contact a dietitian for more information. Where to find more information To calculate the number of calories you need based on your height, weight, and activity level, you can use an online calculator such as:  www.choosemyplate.gov/MyPlatePlan To calculate how much weight you should gain during pregnancy, you can use an online pregnancy weight gain calculator such as:  www.choosemyplate.gov/pregnancy-weight-gain-calculator Summary  While you are pregnant, your body requires additional nutrition to help support your growing baby.  Eat a variety of foods, especially fruits and vegetables to get a full range of vitamins and minerals.  Practice good food safety and cleanliness. Wash your hands before you eat and after you prepare raw meat. Wash all fruits and vegetables well before peeling or eating. Taking these actions can help to prevent food-borne illnesses, such as listeriosis, that can be very dangerous to your baby.  Do not eat raw meat or fish. Do not eat fish that have high mercury content, such as tilefish, shark, swordfish, and king mackerel. Do not eat unpasteurized (raw) dairy.  Take a prenatal vitamin to help meet your additional vitamin and  mineral needs during pregnancy, specifically for folic acid, iron, calcium, and vitamin D. This information is not intended to replace advice given to you by your health care provider. Make sure you discuss any questions you have with your health care provider. Document Revised: 07/08/2018 Document Reviewed: 11/14/2016 Elsevier Patient Education  2020 Else[employer] Inc. Prenatal Care Prenatal care is health care during pregnancy. It helps you and your unborn baby (fetus) stay as healthy as possible. Prenatal care may be provided by a midwife, a family practice health care provider, or a childbirth and pregnancy specialist (obstetrician). How does this affect me? During pregnancy, you will be closely monitored   for any new conditions that might develop. To lower your risk of pregnancy complications, you and your health care provider will talk about any underlying conditions you have. How does this affect my baby? Early and consistent prenatal care increases the chance that your baby will be healthy during pregnancy. Prenatal care lowers the risk that your baby will be:  Born early (prematurely).  Smaller than expected at birth (small for gestational age). What can I expect at the first prenatal care visit? Your first prenatal care visit will likely be the longest. You should schedule your first prenatal care visit as soon as you know that you are pregnant. Your first visit is a good time to talk about any questions or concerns you have about pregnancy. At your visit, you and your health care provider will talk about:  Your medical history, including: ? Any past pregnancies. ? Your family's medical history. ? The baby's father's medical history. ? Any long-term (chronic) health conditions you have and how you manage them. ? Any surgeries or procedures you have had. ? Any current over-the-counter or prescription medicines, herbs, or supplements you are taking.  Other factors that could pose a risk  to your baby, including:  Your home setting and your stress levels, including: ? Exposure to abuse or violence. ? Household financial strain. ? Mental health conditions you have.  Your daily health habits, including diet and exercise. Your health care provider will also:  Measure your weight, height, and blood pressure.  Do a physical exam, including a pelvic and breast exam.  Perform blood tests and urine tests to check for: ? Urinary tract infection. ? Sexually transmitted infections (STIs). ? Low iron levels in your blood (anemia). ? Blood type and certain proteins on red blood cells (Rh antibodies). ? Infections and immunity to viruses, such as hepatitis B and rubella. ? HIV (human immunodeficiency virus).  Do an ultrasound to confirm your baby's growth and development and to help predict your estimated due date (EDD). This ultrasound is done with a probe that is inserted into the vagina (transvaginal ultrasound).  Discuss your options for genetic screening.  Give you information about how to keep yourself and your baby healthy, including: ? Nutrition and taking vitamins. ? Physical activity. ? How to manage pregnancy symptoms such as nausea and vomiting (morning sickness). ? Infections and substances that may be harmful to your baby and how to avoid them. ? Food safety. ? Dental care. ? Working. ? Travel. ? Warning signs to watch for and when to call your health care provider. How often will I have prenatal care visits? After your first prenatal care visit, you will have regular visits throughout your pregnancy. The visit schedule is often as follows:  Up to week 28 of pregnancy: once every 4 weeks.  28-36 weeks: once every 2 weeks.  After 36 weeks: every week until delivery. Some women may have visits more or less often depending on any underlying health conditions and the health of the baby. Keep all follow-up and prenatal care visits as told by your health care  provider. This is important. What happens during routine prenatal care visits? Your health care provider will:  Measure your weight and blood pressure.  Check for fetal heart sounds.  Measure the height of your uterus in your abdomen (fundal height). This may be measured starting around week 20 of pregnancy.  Check the position of your baby inside your uterus.  Ask questions about your diet, sleeping patterns, and   whether you can feel the baby move.  Review warning signs to watch for and signs of labor.  Ask about any pregnancy symptoms you are having and how you are dealing with them. Symptoms may include: ? Headaches. ? Nausea and vomiting. ? Vaginal discharge. ? Swelling. ? Fatigue. ? Constipation. ? Any discomfort, including back or pelvic pain. Make a list of questions to ask your health care provider at your routine visits. What tests might I have during prenatal care visits? You may have blood, urine, and imaging tests throughout your pregnancy, such as:  Urine tests to check for glucose, protein, or signs of infection.  Glucose tests to check for a form of diabetes that can develop during pregnancy (gestational diabetes mellitus). This is usually done around week 24 of pregnancy.  An ultrasound to check your baby's growth and development and to check for birth defects. This is usually done around week 20 of pregnancy.  A test to check for group B strep (GBS) infection. This is usually done around week 36 of pregnancy.  Genetic testing. This may include blood or imaging tests, such as an ultrasound. Some genetic tests are done during the first trimester and some are done during the second trimester. What else can I expect during prenatal care visits? Your health care provider may recommend getting certain vaccines during pregnancy. These may include:  A yearly flu shot (annual influenza vaccine). This is especially important if you will be pregnant during flu  season.  Tdap (tetanus, diphtheria, pertussis) vaccine. Getting this vaccine during pregnancy can protect your baby from whooping cough (pertussis) after birth. This vaccine may be recommended between weeks 27 and 36 of pregnancy. Later in your pregnancy, your health care provider may give you information about:  Childbirth and breastfeeding classes.  Choosing a health care provider for your baby.  Umbilical cord banking.  Breastfeeding.  Birth control after your baby is born.  The hospital labor and delivery unit and how to tour it.  Registering at the hospital before you go into labor. Where to find more information  Office on Women's Health: womenshealth.gov  American Pregnancy Association: americanpregnancy.org  March of Dimes: marchofdimes.org Summary  Prenatal care helps you and your baby stay as healthy as possible during pregnancy.  Your first prenatal care visit will most likely be the longest.  You will have visits and tests throughout your pregnancy to monitor your health and your baby's health.  Bring a list of questions to your visits to ask your health care provider.  Make sure to keep all follow-up and prenatal care visits with your health care provider. This information is not intended to replace advice given to you by your health care provider. Make sure you discuss any questions you have with your health care provider. Document Revised: 06/09/2018 Document Reviewed: 02/16/2017 Elsevier Patient Education  2020 Elsevier Inc.     COVID-19 and Your Pregnancy FAQ  How can I prevent infection with COVID-19 during my pregnancy? Social distancing is key. Please limit any interactions in public. Try and work from home if possible. Frequently wash your hands after touching possibly contaminated surfaces. Avoid touching your face.  Minimize trips to the store. Consider online ordering when possible.   Should I wear a mask? YES. It is recommended by the CDC  that all people wear a cloth mask or facial covering in public. You should wear a mask to your visits in the office. This will help reduce transmission as well as your   risk or acquiring COVID-19. New studies are showing that even asymptomatic individuals can spread the virus from talking.   Where can I get a mask? [place] and the city of [place] are partnering to provide masks to community members. You can pick up a mask from several locations. This website also has instructions about how to make a mask by sewing or without sewing by using a t-shirt or bandana.  https://www.[place].gov/i-want-to/learn-about/covid-19-information-and-updates/covid-19-face-mask-project  Studies have shown that if you were a tube or nylon stocking from pantyhose over a cloth mask it makes the cloth mask almost as effective as a N95 mask.  https://www.npr.org/sections/goatsandsoda/2018/06/23/840146830/adding-a-nylon-stocking-layer-could-boost-protection-from-cloth-masks-study-find  What are the symptoms of COVID-19? Fever (greater than 100.4 F), dry cough, shortness of breath.  Am I more at risk for COVID-19 since I am pregnant? There is not currently data showing that pregnant women are more adversely impacted by COVID-19 than the general population. However, we know that pregnant women tend to have worse respiratory complications from similar diseases such as the flu and SARS and for this reason should be considered an at-risk population.  What do I do if I am experiencing the symptoms of COVID-19? Testing is being limited because of test availability. If you are experiencing symptoms you should quarantine yourself, and the members of your family, for at least 2 weeks at home.   Please visit this website for more information: https://www.cdc.gov/coronavirus/2019-ncov/if-you-are-sick/steps-when-sick.html  When should I go to the Emergency Room? Please go to the emergency room if you are experiencing  ANY of these symptoms*:  1.    Difficulty breathing or shortness of breath 2.    Persistent pain or pressure in the chest 3.    Confusion or difficulty being aroused (or awakened) 4.    Bluish lips or face  *This list is not all inclusive. Please consult our office for any other symptoms that are severe or concerning.  What do I do if I am having difficulty breathing? You should go to the Emergency Room for evaluation. At this time they have a tent set up for evaluating patients with COVID-19 symptoms.   How will my prenatal care be different because of the COVID-19 pandemic? It has been recommended to reduce the frequency of face-to-face visits and use resources such as telephone and virtual visits when possible. Using a scale, blood pressure machine and fetal doppler at home can further help reduce face-to-face visits. You will be provided with additional information on this topic.  We ask that you come to your visits alone to minimize potential exposures to  COVID-19.  How can I receive childbirth education? At this time in-person classes have been cancelled. You can register for online childbirth education, breastfeeding, and newborn care classes.  Please visit:  [email or web address] for more information  How will my hospital birth experience be different? The hospital is currently limiting visitors. This means that while you are in labor you can only have one person at the hospital with you. Additional family members will not be allowed to wait in the building or outside your room. Your one support person can be the father of the baby, a relative, a doula, or a friend. Once one support person is designated that person will wear a band. This band cannot be shared with multiple people.  Nitrous Gas is not being offered for pain relief since the tubing and filter for the machine can not be sanitized in a way to guarantee prevention of transmission of COVID-19.  Nasal cannula use of    oxygen for fetal indications has also been discontinued.  Currently a clear plastic sheet is being hung between mom and the delivering provider during pushing and delivery to help prevent transmission of COVID-19.      How long will I stay in the hospital for after giving birth? It is also recommended that discharge home be expedited during the COVID-19 outbreak. This means staying for 1 day after a vaginal delivery and 2 days after a cesarean section. Patients who need to stay longer for medical reasons are allowed to do so, but the goal will be for expedited discharge home.   What if I have COVID-19 and I am in labor? We ask that you wear a mask while on labor and delivery. We will try and accommodate you being placed in a room that is capable of filtering the air. Please call ahead if you are in labor and on your way to the hospital. The phone number for labor and delivery at [facility] is [phone].  If I have COVID-19 when my baby is born how can I prevent my baby from contracting COVID-19? This is an issue that will have to be discussed on a case-by-case basis. Current recommendations suggest providing separate isolation rooms for both the mother and new infant as well as limiting visitors. However, there are practical challenges to this recommendation. The situation will assuredly change and decisions will be influenced by the desires of the mother and availability of space.  Some suggestions are the use of a curtain or physical barrier between mom and infant, hand hygiene, mom wearing a mask, or 6 feet of spacing between a mom and infant.   Can I breastfeed during the COVID-19 pandemic?   Yes, breastfeeding is encouraged.  Can I breastfeed if I have COVID-19? Yes. Covid-19 has not been found in breast milk. This means you cannot give COVID-19 to your child through breast milk. Breast feeding will also help pass antibodies to fight infection to your baby.   What  precautions should I take when breastfeeding if I have COVID-19? If a mother and newborn do room-in and the mother wishes to feed at the breast, she should put on a facemask and practice hand hygiene before each feeding.  What precautions should I take when pumping if I have COVID-19? Prior to expressing breast milk, mothers should practice hand hygiene. After each pumping session, all parts that come into contact with breast milk should be thoroughly washed and the entire pump should be appropriately disinfected per the manufacturer's instructions. This expressed breast milk should be fed to the newborn by a healthy caregiver.  What if I am pregnant and work in healthcare? Based on limited data regarding COVID-19 and pregnancy, ACOG currently does not propose creating additional restrictions on pregnant health care personnel because of COVID-19 alone. Pregnant women do not appear to be at higher risk of severe disease related to COVID-19. Pregnant health care personnel should follow CDC risk assessment and infection control guidelines for health care personnel exposed to patients with suspected or confirmed COVID-19. Adherence to recommended infection prevention and control practices is an important part of protecting all health care personnel in health care settings.    Information on COVID-19 in pregnancy is very limited; however, facilities may want to consider limiting exposure of pregnant health care personnel to patients with confirmed or suspected COVID-19 infection, especially during higher-risk procedures (eg, aerosol-generating procedures), if feasible, based on staffing availability.    

## 2019-06-07 NOTE — Progress Notes (Signed)
New Obstetric Patient H&P    Chief Complaint: "Desires prenatal care"   History of Present Illness: Patient is a 29 y.o. [id number] Not Hispanic or Latino female, presents with amenorrhea and positive home pregnancy test. Patient's last menstrual period was 04/21/2019 (exact date). and based on her  LMP, her EDD is Estimated Date of Delivery: 01/26/2020. and her EGA is 6 weeks 5 days. Cycles are 5. days, regular, and occur approximately every : 28 days. Her last pap smear was 2 years ago and was no abnormalities.    She had a urine pregnancy test which was positive 2 or 3 week(s)  ago. Her last menstrual period was normal and lasted for  5 day(s). Since her LMP she claims she has experienced breast tenderness. She denies vaginal bleeding. Her past medical history is noncontributory. Her prior pregnancies are notable for G1 2012 FT SVD 7#5oz female, G2 2016 FT SVD 7#5oz female.  Since her LMP, she admits to the use of tobacco products  no She claims she has gained   no pounds since the start of her pregnancy.  There are cats in the home in the home  no  She admits close contact with children on a regular basis  yes  She has had chicken pox in the past yes She has had Tuberculosis exposures, symptoms, or previously tested positive for TB   no Current or past history of domestic violence. no  Genetic Screening/Teratology Counseling: (Includes patient, baby's father, or anyone in either family with:)   1. Patient's age >/= 81 at [facility]  no 2. Thalassemia ([place], [place], Mediterranean, or Asian background): MCV<80  no 3. Neural tube defect (meningomyelocele, spina bifida, anencephaly)  no 4. Congenital heart defect  no  5. Down syndrome  no 6. Tay-Sachs (Jewish, [place]  no 7. Canavan's Disease  no 8. Sickle cell disease or trait (African)  no  9. Hemophilia or other blood disorders  no  10. Muscular dystrophy  no  11. Cystic fibrosis  no  12. Huntington's Chorea  no  13. Mental  retardation/autism  no 14. Other inherited genetic or chromosomal disorder  no 15. Maternal metabolic disorder (DM, PKU, etc)  no 16. Patient or FOB with a child with a birth defect not listed above no  16a. Patient or FOB with a birth defect themselves no 17. Recurrent pregnancy loss, or stillbirth  no  18. Any medications since LMP other than prenatal vitamins (include vitamins, supplements, OTC meds, drugs, alcohol)  no 19. Any other genetic/environmental exposure to discuss  no  Infection History:   1. Lives with someone with TB or TB exposed  no  2. Patient or partner has history of genital herpes  no 3. Rash or viral illness since LMP  no 4. History of STI (GC, CT, HPV, syphilis, HIV)  no 5. History of recent travel :  no  Other pertinent information:  no     Review of Systems:10 point review of systems negative unless otherwise noted in HPI  Past Medical History:  Patient Active Problem List   Diagnosis Date Noted  . Supervision of high risk pregnancy, antepartum 06/07/2019    Clinic [facility] Prenatal Labs  Dating  Blood type: --/--/O POS (03/16 0831)   Genetic Screen 1 Screen:    AFP:     Quad:     NIPS: Antibody:NEG (03/16 0831)  Anatomic [place]  Rubella:   Varicella: @VZVIGG @  GTT Early:  Third trimester:  RPR:     Rhogam  HBsAg:     Vaccines TDAP:                       Flu Shot: HIV:     Baby Food Breast                               GBS:   Contraception  Pap: 2019 negative  CBB     CS/VBAC NA BMI 43 at NOB: early 1 hr gtt, baby ASA, 3rd trimester APT  Support Person Tim       . Obesity affecting pregnancy, antepartum 06/07/2019  . BMI 40.0-44.9, adult (HCC) 06/07/2019    Past Surgical History:  Past Surgical History:  Procedure Laterality Date  . ETHMOIDECTOMY Bilateral 05/13/2018   Procedure: ETHMOIDECTOMY;  Surgeon: Vernie Murders, MD;  Location: [facility];  Service: ENT;  Laterality: Bilateral;  . IMAGE GUIDED SINUS SURGERY  Bilateral 05/13/2018   Procedure: IMAGE GUIDED SINUS SURGERY WITH PROPEL IMPLANT;  Surgeon: Vernie Murders, MD;  Location: [facility];  Service: ENT;  Laterality: Bilateral;  stryker disk needed PROPEL IMPLANT PLACEMENT GAVE DISK TO CECE 2-28 KP  . MAXILLARY ANTROSTOMY Bilateral 05/13/2018   Procedure: MAXILLARY ANTROSTOMY removal of tissue;  Surgeon: Vernie Murders, MD;  Location: [facility];  Service: ENT;  Laterality: Bilateral;  . NASAL SINUS SURGERY Bilateral 05/13/2018   Procedure: FRONTAL SINUSOTOMY;  Surgeon: Vernie Murders, MD;  Location: [facility];  Service: ENT;  Laterality: Bilateral;  . nexplanon insertion  2012;10/21/2013;  . SPHENOIDECTOMY Bilateral 05/13/2018   Procedure: SPHENOIDECTOMY with tissue removal;  Surgeon: Vernie Murders, MD;  Location: [facility];  Service: ENT;  Laterality: Bilateral;  . TOOTH EXTRACTION      Gynecologic History: Patient's last menstrual period was 04/21/2019 (exact date).  Obstetric History: [id number]  Family History:  History reviewed. No pertinent family history.  Social History:  Social History   Socioeconomic History  . Marital status: Single    Spouse name: Not on file  . Number of children: 2  . Years of education: 55  . Highest education level: Not on file  Occupational History  . Occupation: Set designer  Tobacco Use  . Smoking status: Never Smoker  . Smokeless tobacco: Never Used  Substance and Sexual Activity  . Alcohol use: No  . Drug use: No  . Sexual activity: Yes    Birth control/protection: None  Other Topics Concern  . Not on file  Social History Narrative  . Not on file   Social Determinants of Health   Financial Resource Strain:   . Difficulty of Paying Living Expenses:   Food Insecurity:   . Worried About Programme researcher, broadcasting/film/video in the Last Year:   . Barista in the Last Year:   Transportation Needs:   . Freight forwarder (Medical):   Marland Kitchen Lack of Transportation  (Non-Medical):   Physical Activity:   . Days of Exercise per Week:   . Minutes of Exercise per Session:   Stress:   . Feeling of Stress :   Social Connections:   . Frequency of Communication with Friends and Family:   . Frequency of Social Gatherings with Friends and Family:   . Attends Religious Services:   . Active Member of Clubs or Organizations:   . Attends Banker Meetings:   Marland Kitchen Marital  Status:   Intimate Partner Violence:   . Fear of Current or Ex-Partner:   . Emotionally Abused:   Marland Kitchen Physically Abused:   . Sexually Abused:     Allergies:  No Known Allergies  Medications: Prior to Admission medications   Medication Sig Start Date End Date Taking? Authorizing Provider  montelukast (SINGULAIR) 10 MG tablet Take 10 mg by mouth daily. 11/18/18  Yes [provider]  PROAIR HFA 108 (90 Base) MCG/ACT inhaler Place 2 puffs into the nose every 6 (six) hours as needed for wheezing or shortness of breath.  06/12/17  Yes [provider]  cetirizine (ZYRTEC) 10 MG tablet Take 10 mg by mouth daily.  11/18/18   [provider]    Physical Exam Vitals: Blood pressure 122/78, weight 255 lb (115.7 kg), last menstrual period 04/21/2019.  General: NAD HEENT: normocephalic, anicteric Thyroid: no enlargement, no palpable nodules Pulmonary: No increased work of breathing, CTAB Cardiovascular: RRR, distal pulses 2+ Abdomen: NABS, soft, non-tender, non-distended.  Umbilicus without lesions.  No hepatomegaly, splenomegaly or masses palpable. No evidence of hernia  Genitourinary:  External: Normal external female genitalia.  Normal urethral meatus, normal  Bartholin's and Skene's glands.    Vagina: Normal vaginal mucosa, no evidence of prolapse.    Cervix: Grossly normal in appearance, no bleeding, no CMT  Uterus:  Non-enlarged, mobile, normal contour.    Adnexa: ovaries non-enlarged, no adnexal masses  Rectal: deferred Extremities: no edema, erythema, or  tenderness Neurologic: Grossly intact Psychiatric: mood appropriate, affect full  The following were addressed during this visit:  Breastfeeding Education - Early initiation of breastfeeding    Comments: Keeps milk supply adequate, helps contract uterus and slow bleeding, and early milk is the perfect first food and is easy to digest.   - The importance of exclusive breastfeeding    Comments: Provides antibodies, Lower risk of breast and ovarian cancers, and type-2 diabetes,Helps your body recover, Reduced chance of SIDS.   - Risks of giving your baby anything other than breast milk if you are breastfeeding    Comments: Make the baby less content with breastfeeds, may make my baby more susceptible to illness, and may reduce my milk supply.   - The importance of early skin-to-skin contact    Comments: Keeps baby warm and secure, helps keep baby's blood sugar up and breathing steady, easier to bond and breastfeed, and helps calm baby.  - Rooming-in on a 24-hour basis    Comments: Easier to learn baby's feeding cues, easier to bond and get to know each other, and encourages milk production.   - Feeding on demand or baby-led feeding    Comments: Helps prevent breastfeeding complications, helps bring in good milk supply, prevents under or overfeeding, and helps baby feel content and satisfied   - Frequent feeding to help assure optimal milk production    Comments: Making a full supply of milk requires frequent removal of milk from breasts, infant will eat 8-12 times in 24 hours, if separated from infant use breast massage, hand expression and/ or pumping to remove milk from breasts.   - Effective positioning and attachment    Comments: Helps my baby to get enough breast milk, helps to produce an adequate milk supply, and helps prevent nipple pain and damage   - Exclusive breastfeeding for the first 6 months    Comments: Builds a healthy milk supply and keeps it up, protects baby  from sickness and disease, and breastmilk has everything your baby  needs for the first 6 months.    Assessment: 29 y.o. G3P2002 at 6 weeks 5 days by LMP presenting to initiate prenatal care  Plan: 1) Avoid alcoholic beverages. 2) Patient encouraged not to smoke.  3) Discontinue the use of all non-medicinal drugs and chemicals.  4) Take prenatal vitamins daily.  5) Nutrition, food safety (fish, cheese advisories, and high nitrite foods) and exercise discussed. 6) Hospital and practice style discussed with cross coverage system.  7) Genetic Screening, such as with 1st Trimester Screening, cell free fetal DNA, AFP testing, and Ultrasound, as well as with amniocentesis and CVS as appropriate, is discussed with patient. At the conclusion of today's visit patient requested cell free DNA genetic testing 8) Patient is asked about travel to areas at risk for the Zika virus, and counseled to avoid travel and exposure to mosquitoes or sexual partners who may have themselves been exposed to the virus. Testing is discussed, and will be ordered as appropriate.  9) Aptima, urine culture today 10) Return in 1 week for dating scan, early 1 hr gtt, NOB panel, sickle cell screen 11) MaterniT 21 at 10-12 weeks   Rod Can, [employer] 06/07/2019, 1:22 PM

## 2019-06-09 LAB — CERVICOVAGINAL ANCILLARY ONLY
Chlamydia: NEGATIVE
Comment: NEGATIVE
Comment: NEGATIVE
Comment: NORMAL
Neisseria Gonorrhea: NEGATIVE
Trichomonas: NEGATIVE

## 2019-06-17 ENCOUNTER — Other Ambulatory Visit: Payer: Self-pay

## 2019-06-17 ENCOUNTER — Other Ambulatory Visit: Payer: Self-pay | Admitting: Advanced Practice Midwife

## 2019-06-17 ENCOUNTER — Ambulatory Visit (INDEPENDENT_AMBULATORY_CARE_PROVIDER_SITE_OTHER): Payer: Medicaid Other

## 2019-06-17 ENCOUNTER — Other Ambulatory Visit: Payer: Medicaid Other

## 2019-06-17 ENCOUNTER — Encounter: Payer: Self-pay | Admitting: Advanced Practice Midwife

## 2019-06-17 ENCOUNTER — Ambulatory Visit (INDEPENDENT_AMBULATORY_CARE_PROVIDER_SITE_OTHER): Payer: Medicaid Other | Admitting: Advanced Practice Midwife

## 2019-06-17 VITALS — BP 110/60 | Wt 255.0 lb

## 2019-06-17 DIAGNOSIS — Z3A08 8 weeks gestation of pregnancy: Secondary | ICD-10-CM

## 2019-06-17 DIAGNOSIS — O0990 Supervision of high risk pregnancy, unspecified, unspecified trimester: Secondary | ICD-10-CM

## 2019-06-17 DIAGNOSIS — Z113 Encounter for screening for infections with a predominantly sexual mode of transmission: Secondary | ICD-10-CM

## 2019-06-17 DIAGNOSIS — O0991 Supervision of high risk pregnancy, unspecified, first trimester: Secondary | ICD-10-CM

## 2019-06-17 DIAGNOSIS — O99210 Obesity complicating pregnancy, unspecified trimester: Secondary | ICD-10-CM

## 2019-06-17 DIAGNOSIS — Z6841 Body Mass Index (BMI) 40.0 and over, adult: Secondary | ICD-10-CM

## 2019-06-17 NOTE — Progress Notes (Signed)
  Routine Prenatal Care Visit  Subjective  Abigail Mejia is a 29 y.o. G3P2002 at [redacted]w[redacted]d being seen today for ongoing prenatal care.  She is currently monitored for the following issues for this high-risk pregnancy and has Supervision of high risk pregnancy, antepartum; Obesity affecting pregnancy, antepartum; and BMI 40.0-44.9, adult (HCC) on their problem list.  ----------------------------------------------------------------------------------- Patient reports mild nausea.    . Vag. Bleeding: None.   . Leaking Fluid denies.  ----------------------------------------------------------------------------------- The following portions of the patient's history were reviewed and updated as appropriate: allergies, current medications, past family history, past medical history, past social history, past surgical history and problem list. Problem list updated.  Objective  Blood pressure 110/60, weight 255 lb (115.7 kg), last menstrual period 04/21/2019. Pregravid weight 255 lb (115.7 kg) Total Weight Gain 0 lb (0 kg) Urinalysis: Urine Protein    Urine Glucose    Fetal Status: Fetal Heart Rate (bpm): 165           Patient Name: Abigail Mejia DOB: 1990-07-22 MRN: [id number]  ULTRASOUND REPORT  Location: [facility] Date of Service: 06/17/2019   Indications:dating Findings:  Abigail Mejia intrauterine pregnancy is visualized with a CRL consistent with [redacted]w[redacted]d gestation, giving an (U/S) EDD of 01/26/2020. The (U/S) EDD is consistent with the clinically established EDD of 01/26/2020.  FHR: 165 BPM CRL measurement: 16.9 mm Yolk sac is visualized and appears normal. Amnion: visualized and appears normal  There is a small implantation bleed measuring 13.5x 4.9 mm.   Right Ovary is normal in appearance. Left Ovary is normal appearance. Corpus luteal cyst:  Left ovary Survey of the adnexa demonstrates no adnexal masses. There is no free peritoneal fluid in the cul de sac.  Impression: 1. [redacted]w[redacted]d  Viable Singleton Intrauterine pregnancy by U/S. 2. (U/S) EDD is consistent with Clinically established EDD of 01/26/2020.  Recommendations: 1.Clinical correlation with the patient's History and Physical Exam.  Deanna Artis, RT  General:  Alert, oriented and cooperative. Patient is in no acute distress.  Skin: Skin is warm and dry. No rash noted.   Cardiovascular: Normal heart rate noted  Respiratory: Normal respiratory effort, no problems with respiration noted  Abdomen: Soft, gravid, appropriate for gestational age.       Pelvic:  Cervical exam deferred        Extremities: Normal range of motion.     Mental Status: Normal mood and affect. Normal behavior. Normal judgment and thought content.   Assessment   29 y.o. G3P2002 at [redacted]w[redacted]d by  01/26/2020, by Last Menstrual Period presenting for routine prenatal visit  Plan  Begin baby ASA at 12 weeks  Preterm labor symptoms and general obstetric precautions including but not limited to vaginal bleeding, contractions, leaking of fluid and fetal movement were reviewed in detail with the patient.   Return in about 4 weeks (around 07/15/2019) for rob and MaterniT 21.  [name], CNM 06/17/2019 2:48 PM

## 2019-06-17 NOTE — Progress Notes (Signed)
ROB/Dating scan/1 hr GTT- had one day spotting last week, some cramps-no sx today

## 2019-06-20 LAB — RPR+RH+ABO+RUB AB+AB SCR+CB...
Antibody Screen: NEGATIVE
HIV Screen 4th Generation wRfx: NONREACTIVE
Hematocrit: 37.6 % (ref 34.0–46.6)
Hemoglobin: 12.7 g/dL (ref 11.1–15.9)
Hepatitis B Surface Ag: NEGATIVE
MCH: 32.1 pg (ref 26.6–33.0)
MCHC: 33.8 g/dL (ref 31.5–35.7)
MCV: 95 fL (ref 79–97)
Platelets: 212 10*3/uL (ref 150–450)
RBC: 3.96 x10E6/uL (ref 3.77–5.28)
RDW: 12 % (ref 11.7–15.4)
RPR Ser Ql: NONREACTIVE
Rh Factor: POSITIVE
Rubella Antibodies, IGG: 1.86 index (ref >0.99)
Varicella zoster IgG: >4000 index (ref >165)
WBC: 4.9 10*3/uL (ref 3.4–10.8)

## 2019-06-20 LAB — HGB FRACTIONATION CASCADE
Hgb A2: 2.6 % (ref 1.8–3.2)
Hgb A: 97.4 % (ref 96.4–98.8)
Hgb F: 0 % (ref 0.0–2.0)
Hgb S: 0 %

## 2019-06-20 LAB — GLUCOSE, 1 HOUR GESTATIONAL: Gestational Diabetes Screen: 77 mg/dL (ref 65–139)

## 2019-07-15 ENCOUNTER — Ambulatory Visit (INDEPENDENT_AMBULATORY_CARE_PROVIDER_SITE_OTHER): Payer: Medicaid Other | Admitting: Advanced Practice Midwife

## 2019-07-15 ENCOUNTER — Other Ambulatory Visit: Payer: Self-pay

## 2019-07-15 ENCOUNTER — Encounter: Payer: Self-pay | Admitting: Advanced Practice Midwife

## 2019-07-15 ENCOUNTER — Telehealth: Payer: Self-pay | Admitting: Advanced Practice Midwife

## 2019-07-15 ENCOUNTER — Encounter: Payer: Medicaid Other | Admitting: Advanced Practice Midwife

## 2019-07-15 VITALS — BP 120/80 | Wt 249.0 lb

## 2019-07-15 DIAGNOSIS — O0991 Supervision of high risk pregnancy, unspecified, first trimester: Secondary | ICD-10-CM

## 2019-07-15 DIAGNOSIS — Z3A12 12 weeks gestation of pregnancy: Secondary | ICD-10-CM

## 2019-07-15 DIAGNOSIS — R300 Dysuria: Secondary | ICD-10-CM

## 2019-07-15 DIAGNOSIS — O99211 Obesity complicating pregnancy, first trimester: Secondary | ICD-10-CM

## 2019-07-15 LAB — POCT URINALYSIS DIPSTICK
Bilirubin, UA: NEGATIVE
Glucose, UA: NEGATIVE
Ketones, UA: NEGATIVE
Nitrite, UA: NEGATIVE
Protein, UA: NEGATIVE
Spec Grav, UA: 1.01 (ref 1.010–1.025)
Urobilinogen, UA: 0.2 E.U./dL
pH, UA: 5 (ref 5.0–8.0)

## 2019-07-15 MED ORDER — CEPHALEXIN 500 MG PO CAPS: 500.0000 mg | ORAL_CAPSULE | Freq: Three times a day (TID) | ORAL | 0 refills | Status: DC

## 2019-07-15 NOTE — Progress Notes (Addendum)
  Routine Prenatal Care Visit  Subjective  Abigail Mejia is a 29 y.o. G3P2002 at [redacted]w[redacted]d being seen today for ongoing prenatal care.  She is currently monitored for the following issues for this high-risk pregnancy and has Supervision of high risk pregnancy, antepartum; Obesity affecting pregnancy, antepartum; and BMI 40.0-44.9, adult (HCC) on their problem list.  ----------------------------------------------------------------------------------- Patient reports dysuria, frequency, urgency since Wednesday of this week. She admits trying to stay hydrated with h2o.    . Vag. Bleeding: None.   . Leaking Fluid denies.  ----------------------------------------------------------------------------------- The following portions of the patient's history were reviewed and updated as appropriate: allergies, current medications, past family history, past medical history, past social history, past surgical history and problem list. Problem list updated.  Objective  Blood pressure 120/80, weight 249 lb (112.9 kg), last menstrual period 04/21/2019. Pregravid weight 255 lb (115.7 kg) Total Weight Gain -6 lb (-2.722 kg) Urinalysis: Urine Protein    Urine Glucose     Results for Abigail Mejia, Abigail Mejia (MRN [id number]) as of 07/15/2019 14:35  Ref. Range 07/15/2019 08:19  Bilirubin, UA Unknown neg  Glucose Latest Ref Range: Negative  Negative  Ketones, UA Unknown neg  Leukocytes,UA Latest Ref Range: Negative  Moderate (2+) (A)  Nitrite, UA Unknown neg  pH, UA Latest Ref Range: 5.0 - 8.0  5.0  Protein,UA Latest Ref Range: Negative  Negative  Specific Gravity, UA Latest Ref Range: 1.010 - 1.025  1.010  Urobilinogen, UA Latest Ref Range: 0.2 or 1.0 E.U./dL 0.2  RBC, UA Unknown trace    Fetal Status: Fetal Heart Rate (bpm): 159         General:  Alert, oriented and cooperative. Patient is in no acute distress.  Skin: Skin is warm and dry. No rash noted.   Cardiovascular: Normal heart rate noted  Respiratory: Normal  respiratory effort, no problems with respiration noted  Abdomen: Soft, gravid, appropriate for gestational age. Pain/Pressure: Present     Pelvic:  Cervical exam deferred        Extremities: Normal range of motion.     Mental Status: Normal mood and affect. Normal behavior. Normal judgment and thought content.   Assessment   29 y.o. G3P2002 at [redacted]w[redacted]d by  01/26/2020, by Last Menstrual Period presenting for routine prenatal visit  Plan   Dysuria: Urine culture, Rx Keflex, follow up as needed after culture results MaterniT 21 today   Preterm labor symptoms and general obstetric precautions including but not limited to vaginal bleeding, contractions, leaking of fluid and fetal movement were reviewed in detail with the patient. Please refer to After Visit Summary for other counseling recommendations.   Return in about 4 weeks (around 08/12/2019) for rob.  [name], CNM 07/15/2019 8:36 AM

## 2019-07-15 NOTE — Telephone Encounter (Signed)
Patient is requesting her mom be called with results of materniti21, she does not want to know.  Abigail Mejia, Abigail Mejia, [phone].

## 2019-07-15 NOTE — Patient Instructions (Signed)

## 2019-07-17 LAB — URINE CULTURE

## 2019-07-20 LAB — MATERNIT 21 PLUS CORE, BLOOD
Fetal Fraction: 6
Result (T21): NEGATIVE
Trisomy 13 (Patau syndrome): NEGATIVE
Trisomy 18 (Edwards syndrome): NEGATIVE
Trisomy 21 (Down syndrome): NEGATIVE

## 2019-08-12 ENCOUNTER — Other Ambulatory Visit: Payer: Self-pay

## 2019-08-12 ENCOUNTER — Ambulatory Visit (INDEPENDENT_AMBULATORY_CARE_PROVIDER_SITE_OTHER): Payer: Medicaid Other | Admitting: Obstetrics and Gynecology

## 2019-08-12 ENCOUNTER — Encounter: Payer: Self-pay | Admitting: Obstetrics and Gynecology

## 2019-08-12 VITALS — BP 120/72 | Ht 67.0 in | Wt 247.6 lb

## 2019-08-12 DIAGNOSIS — O99211 Obesity complicating pregnancy, first trimester: Secondary | ICD-10-CM

## 2019-08-12 DIAGNOSIS — O0991 Supervision of high risk pregnancy, unspecified, first trimester: Secondary | ICD-10-CM

## 2019-08-12 DIAGNOSIS — Z3A16 16 weeks gestation of pregnancy: Secondary | ICD-10-CM

## 2019-08-12 DIAGNOSIS — O0992 Supervision of high risk pregnancy, unspecified, second trimester: Secondary | ICD-10-CM

## 2019-08-12 DIAGNOSIS — O0990 Supervision of high risk pregnancy, unspecified, unspecified trimester: Secondary | ICD-10-CM

## 2019-08-12 DIAGNOSIS — O99212 Obesity complicating pregnancy, second trimester: Secondary | ICD-10-CM

## 2019-08-12 LAB — POCT URINALYSIS DIPSTICK OB
Glucose, UA: NEGATIVE
POC,PROTEIN,UA: NEGATIVE

## 2019-08-12 NOTE — Patient Instructions (Addendum)

## 2019-08-12 NOTE — Progress Notes (Signed)
Routine Prenatal Care Visit  Subjective  Abigail Mejia is a 29 y.o. G3P2002 at [redacted]w[redacted]d being seen today for ongoing prenatal care.  She is currently monitored for the following issues for this high-risk pregnancy and has Supervision of high risk pregnancy, antepartum; Obesity affecting pregnancy, antepartum; and BMI 40.0-44.9, adult (HCC) on their problem list.  ----------------------------------------------------------------------------------- Patient reports no complaints.   Contractions: Not present. Vag. Bleeding: None.  Movement: Present. Denies leaking of fluid.  ----------------------------------------------------------------------------------- The following portions of the patient's history were reviewed and updated as appropriate: allergies, current medications, past family history, past medical history, past social history, past surgical history and problem list. Problem list updated.   Objective  Blood pressure 120/72, height 5\' 7"  (1.702 m), weight 247 lb 9.6 oz (112.3 kg), last menstrual period 04/21/2019. Pregravid weight 255 lb (115.7 kg) Total Weight Gain -7 lb 6.4 oz (-3.357 kg) Urinalysis:      Fetal Status: Fetal Heart Rate (bpm): 145   Movement: Present     General:  Alert, oriented and cooperative. Patient is in no acute distress.  Skin: Skin is warm and dry. No rash noted.   Cardiovascular: Normal heart rate noted  Respiratory: Normal respiratory effort, no problems with respiration noted  Abdomen: Soft, gravid, appropriate for gestational age. Pain/Pressure: Absent     Pelvic:  Cervical exam deferred        Extremities: Normal range of motion.  Edema: None  Mental Status: Normal mood and affect. Normal behavior. Normal judgment and thought content.     Assessment   29 y.o. G3P2002 at [redacted]w[redacted]d by  01/26/2020, by Last Menstrual Period presenting for routine prenatal visit  Plan   pregnancy Problems (from 06/07/19 to present)    Problem Noted Resolved    Supervision of high risk pregnancy, antepartum 06/07/2019 by 08/07/2019, CNM No   Overview Addendum 08/12/2019  8:37 AM by 10/12/2019, MD    [facility]inic [facility] Prenatal Labs  Dating  LMP = 8wk Blood type: O/Positive/-- (04/16 1452)   Genetic Screen NIPS: Normal xy Antibody:Negative (04/16 1452)  Anatomic 08[birth year]-1993  Rubella: 1.86 (04/16 1452)  Varicella: immune  GTT Early:       77         Third trimester:  RPR: Non Reactive (04/16 1452)   Rhogam Not needed HBsAg: Negative (04/16 1452)   Vaccines TDAP:                       Flu Shot: HIV: Non Reactive (04/16 1452)   Baby Food Breast                               GBS:   Contraception Desires BTL: consent [ ]  Pap: 2019 negative  CBB     CS/VBAC NA BMI 43 at NOB: early 1 hr gtt, baby ASA, 3rd trimester APT  Support Person Tim          Previous Version   Obesity affecting pregnancy, antepartum 06/07/2019 by 2020, CNM No     Discussed COVID vaccination Discussed ASA initation  Gestational age appropriate obstetric precautions including but not limited to vaginal bleeding, contractions, leaking of fluid and fetal movement were reviewed in detail with the patient.    Return in about 4 weeks (around 09/09/2019) for ROB and anatomy Abigail Mejia.  11/10/2019 MD [facility], [facility] 08/12/2019, 8:35 AM

## 2019-09-09 ENCOUNTER — Ambulatory Visit (INDEPENDENT_AMBULATORY_CARE_PROVIDER_SITE_OTHER): Payer: Medicaid Other

## 2019-09-09 ENCOUNTER — Encounter: Payer: Self-pay | Admitting: Obstetrics & Gynecology

## 2019-09-09 ENCOUNTER — Ambulatory Visit (INDEPENDENT_AMBULATORY_CARE_PROVIDER_SITE_OTHER): Payer: Medicaid Other | Admitting: Obstetrics & Gynecology

## 2019-09-09 ENCOUNTER — Other Ambulatory Visit: Payer: Self-pay

## 2019-09-09 VITALS — BP 120/80 | Wt 249.0 lb

## 2019-09-09 DIAGNOSIS — O0992 Supervision of high risk pregnancy, unspecified, second trimester: Secondary | ICD-10-CM

## 2019-09-09 DIAGNOSIS — Z3A20 20 weeks gestation of pregnancy: Secondary | ICD-10-CM

## 2019-09-09 DIAGNOSIS — Z6841 Body Mass Index (BMI) 40.0 and over, adult: Secondary | ICD-10-CM

## 2019-09-09 DIAGNOSIS — O99211 Obesity complicating pregnancy, first trimester: Secondary | ICD-10-CM

## 2019-09-09 DIAGNOSIS — Z131 Encounter for screening for diabetes mellitus: Secondary | ICD-10-CM

## 2019-09-09 DIAGNOSIS — O0991 Supervision of high risk pregnancy, unspecified, first trimester: Secondary | ICD-10-CM

## 2019-09-09 LAB — POCT URINALYSIS DIPSTICK OB
Glucose, UA: NEGATIVE
POC,PROTEIN,UA: NEGATIVE

## 2019-09-09 NOTE — Progress Notes (Signed)
  Subjective  Fetal Movement? yes Contractions? no Leaking Fluid? no Vaginal Bleeding? no  Objective  BP 120/80   Wt 249 lb (112.9 kg)   LMP 04/21/2019 (Exact Date)   BMI 39.00 kg/m  General: NAD Pumonary: no increased work of breathing Abdomen: gravid, non-tender Extremities: no edema Psychiatric: mood appropriate, affect full  Review of ULTRASOUND. I have personally reviewed images and report of recent ultrasound done at [facility]. There is a singleton gestation with subjectively normal amniotic fluid volume. The fetal biometry correlates with established dating. Detailed evaluation of the fetal anatomy was performed.The fetal anatomical survey appears within normal limits within the resolution of ultrasound as described above.  It must be noted that a normal ultrasound is unable to rule out fetal aneuploidy.    Assessment  29 y.o. G3P2002 at [redacted]w[redacted]d by  01/26/2020, by Last Menstrual Period presenting for routine prenatal visit  Plan   Problem List Items Addressed This Visit      Other   BMI 40.0-44.9, adult (HCC)    Other Visit Diagnoses    [redacted] weeks gestation of pregnancy    -  Primary   Supervision of high risk pregnancy in second trimester       Obesity affecting pregnancy in first trimester       Screening for diabetes mellitus       Relevant Orders   28 Week RH+Panel      pregnancy Problems (from 06/07/19 to present)    Problem Noted Resolved   Supervision of high risk pregnancy, antepartum 06/07/2019 by [name], CNM No   Overview Addendum 08/12/2019  8:37 AM by Natale Milch, MD    [facility]inic [facility] Prenatal Labs  Dating  LMP = 8wk Blood type: O/Positive/-- (04/16 1452)   Genetic Screen NIPS: Normal xy Antibody:Negative (04/16 1452)  Anatomic [place]  Rubella: 1.86 (04/16 1452)  Varicella: immune  GTT Early:       77         Third trimester:  RPR: Non Reactive (04/16 1452)   Rhogam Not needed HBsAg: Negative (04/16 1452)   Vaccines TDAP:                        Flu Shot: HIV: Non Reactive (04/16 1452)   Baby Food Breast                               GBS:   Contraception Desires BTL: consent [ ]  Pap: 2019 negative  CBB     CS/VBAC NA BMI 43 at NOB: early 1 hr gtt, baby ASA, 3rd trimester APT  Support Person Tim         PNV BTL counseled  2020, MD, [facility], [facility] 09/09/2019  10:51 AM

## 2019-09-09 NOTE — Patient Instructions (Signed)
Thank you for choosing [facility]. As part of our ongoing efforts to improve patient experience, we would appreciate your feedback. Please fill out the short survey that you will receive by mail or MyChart. Your opinion is important to us! -Dr Shepherd  Second Trimester of Pregnancy The second trimester is from week 14 through week 27 (months 4 through 6). The second trimester is often a time when you feel your best. Your body has adjusted to being pregnant, and you begin to feel better physically. Usually, morning sickness has lessened or quit completely, you may have more energy, and you may have an increase in appetite. The second trimester is also a time when the fetus is growing rapidly. At the end of the sixth month, the fetus is about 9 inches long and weighs about 1 pounds. You will likely begin to feel the baby move (quickening) between 16 and 20 weeks of pregnancy. Body changes during your second trimester Your body continues to go through many changes during your second trimester. The changes vary from woman to woman.  Your weight will continue to increase. You will notice your lower abdomen bulging out.  You may begin to get stretch marks on your hips, abdomen, and breasts.  You may develop headaches that can be relieved by medicines. The medicines should be approved by your health care provider.  You may urinate more often because the fetus is pressing on your bladder.  You may develop or continue to have heartburn as a result of your pregnancy.  You may develop constipation because certain hormones are causing the muscles that push waste through your intestines to slow down.  You may develop hemorrhoids or swollen, bulging veins (varicose veins).  You may have back pain. This is caused by: ? Weight gain. ? Pregnancy hormones that are relaxing the joints in your pelvis. ? A shift in weight and the muscles that support your balance.  Your breasts will continue to grow and  they will continue to become tender.  Your gums may bleed and may be sensitive to brushing and flossing.  Dark spots or blotches (chloasma, mask of pregnancy) may develop on your face. This will likely fade after the baby is born.  A dark line from your belly button to the pubic area (linea nigra) may appear. This will likely fade after the baby is born.  You may have changes in your hair. These can include thickening of your hair, rapid growth, and changes in texture. Some women also have hair loss during or after pregnancy, or hair that feels dry or thin. Your hair will most likely return to normal after your baby is born. What to expect at prenatal visits During a routine prenatal visit:  You will be weighed to make sure you and the fetus are growing normally.  Your blood pressure will be taken.  Your abdomen will be measured to track your baby's growth.  The fetal heartbeat will be listened to.  Any test results from the previous visit will be discussed. Your health care provider may ask you:  How you are feeling.  If you are feeling the baby move.  If you have had any abnormal symptoms, such as leaking fluid, bleeding, severe headaches, or abdominal cramping.  If you are using any tobacco products, including cigarettes, chewing tobacco, and electronic cigarettes.  If you have any questions. Other tests that may be performed during your second trimester include:  Blood tests that check for: ? Low iron levels (  anemia). ? High blood sugar that affects pregnant women (gestational diabetes) between 24 and 28 weeks. ? Rh antibodies. This is to check for a protein on red blood cells (Rh factor).  Urine tests to check for infections, diabetes, or protein in the urine.  An ultrasound to confirm the proper growth and development of the baby.  An amniocentesis to check for possible genetic problems.  Fetal screens for spina bifida and Down syndrome.  HIV (human  immunodeficiency virus) testing. Routine prenatal testing includes screening for HIV, unless you choose not to have this test. Follow these instructions at home: Medicines  Follow your health care provider's instructions regarding medicine use. Specific medicines may be either safe or unsafe to take during pregnancy.  Take a prenatal vitamin that contains at least 600 micrograms (mcg) of folic acid.  If you develop constipation, try taking a stool softener if your health care provider approves. Eating and drinking   Eat a balanced diet that includes fresh fruits and vegetables, whole grains, good sources of protein such as meat, eggs, or tofu, and low-fat dairy. Your health care provider will help you determine the amount of weight gain that is right for you.  Avoid raw meat and uncooked cheese. These carry germs that can cause birth defects in the baby.  If you have low calcium intake from food, talk to your health care provider about whether you should take a daily calcium supplement.  Limit foods that are high in fat and processed sugars, such as fried and sweet foods.  To prevent constipation: ? Drink enough fluid to keep your urine clear or pale yellow. ? Eat foods that are high in fiber, such as fresh fruits and vegetables, whole grains, and beans. Activity  Exercise only as directed by your health care provider. Most women can continue their usual exercise routine during pregnancy. Try to exercise for 30 minutes at least 5 days a week. Stop exercising if you experience uterine contractions.  Avoid heavy lifting, wear low heel shoes, and practice good posture.  A sexual relationship may be continued unless your health care provider directs you otherwise. Relieving pain and discomfort  Wear a good support bra to prevent discomfort from breast tenderness.  Take warm sitz baths to soothe any pain or discomfort caused by hemorrhoids. Use hemorrhoid cream if your health care  provider approves.  Rest with your legs elevated if you have leg cramps or low back pain.  If you develop varicose veins, wear support hose. Elevate your feet for 15 minutes, 3-4 times a day. Limit salt in your diet. Prenatal Care  Write down your questions. Take them to your prenatal visits.  Keep all your prenatal visits as told by your health care provider. This is important. Safety  Wear your seat belt at all times when driving.  Make a list of emergency phone numbers, including numbers for family, friends, the hospital, and police and fire departments. General instructions  Ask your health care provider for a referral to a local prenatal education class. Begin classes no later than the beginning of month 6 of your pregnancy.  Ask for help if you have counseling or nutritional needs during pregnancy. Your health care provider can offer advice or refer you to specialists for help with various needs.  Do not use hot tubs, steam rooms, or saunas.  Do not douche or use tampons or scented sanitary pads.  Do not cross your legs for long periods of time.  Avoid cat litter   boxes and soil used by cats. These carry germs that can cause birth defects in the baby and possibly loss of the fetus by miscarriage or stillbirth.  Avoid all smoking, herbs, alcohol, and unprescribed drugs. Chemicals in these products can affect the formation and growth of the baby.  Do not use any products that contain nicotine or tobacco, such as cigarettes and e-cigarettes. If you need help quitting, ask your health care provider.  Visit your dentist if you have not gone yet during your pregnancy. Use a soft toothbrush to brush your teeth and be gentle when you floss. Contact a health care provider if:  You have dizziness.  You have mild pelvic cramps, pelvic pressure, or nagging pain in the abdominal area.  You have persistent nausea, vomiting, or diarrhea.  You have a bad smelling vaginal  discharge.  You have pain when you urinate. Get help right away if:  You have a fever.  You are leaking fluid from your vagina.  You have spotting or bleeding from your vagina.  You have severe abdominal cramping or pain.  You have rapid weight gain or weight loss.  You have shortness of breath with chest pain.  You notice sudden or extreme swelling of your face, hands, ankles, feet, or legs.  You have not felt your baby move in over an hour.  You have severe headaches that do not go away when you take medicine.  You have vision changes. Summary  The second trimester is from week 14 through week 27 (months 4 through 6). It is also a time when the fetus is growing rapidly.  Your body goes through many changes during pregnancy. The changes vary from woman to woman.  Avoid all smoking, herbs, alcohol, and unprescribed drugs. These chemicals affect the formation and growth your baby.  Do not use any tobacco products, such as cigarettes, chewing tobacco, and e-cigarettes. If you need help quitting, ask your health care provider.  Contact your health care provider if you have any questions. Keep all prenatal visits as told by your health care provider. This is important. This information is not intended to replace advice given to you by your health care provider. Make sure you discuss any questions you have with your health care provider. Document Revised: 06/11/2018 Document Reviewed: 03/25/2016 Elsevier Patient Education  2020 [employer] Inc[employer]  

## 2019-09-09 NOTE — Addendum Note (Signed)
Addended by: Cornelius Moras D on: 09/09/2019 10:56 AM   Modules accepted: Orders

## 2019-10-07 ENCOUNTER — Encounter: Payer: Self-pay | Admitting: Advanced Practice Midwife

## 2019-10-07 ENCOUNTER — Other Ambulatory Visit: Payer: Self-pay

## 2019-10-07 ENCOUNTER — Ambulatory Visit (INDEPENDENT_AMBULATORY_CARE_PROVIDER_SITE_OTHER): Payer: Medicaid Other | Admitting: Advanced Practice Midwife

## 2019-10-07 VITALS — BP 122/74 | Wt 248.0 lb

## 2019-10-07 DIAGNOSIS — Z131 Encounter for screening for diabetes mellitus: Secondary | ICD-10-CM

## 2019-10-07 DIAGNOSIS — Z130 Encounter for screening for diseases of the blood and blood-forming organs and certain disorders involving the immune mechanism: Secondary | ICD-10-CM

## 2019-10-07 DIAGNOSIS — O99212 Obesity complicating pregnancy, second trimester: Secondary | ICD-10-CM

## 2019-10-07 DIAGNOSIS — O0992 Supervision of high risk pregnancy, unspecified, second trimester: Secondary | ICD-10-CM

## 2019-10-07 DIAGNOSIS — Z3A24 24 weeks gestation of pregnancy: Secondary | ICD-10-CM

## 2019-10-07 DIAGNOSIS — Z113 Encounter for screening for infections with a predominantly sexual mode of transmission: Secondary | ICD-10-CM

## 2019-10-07 NOTE — Progress Notes (Signed)
No vb. No lof.  

## 2019-10-07 NOTE — Progress Notes (Signed)
  Routine Prenatal Care Visit  Subjective  Abigail Mejia is a 29 y.o. G3P2002 at [redacted]w[redacted]d being seen today for ongoing prenatal care.  She is currently monitored for the following issues for this high-risk pregnancy and has Supervision of high risk pregnancy, antepartum; Obesity affecting pregnancy, antepartum; and BMI 40.0-44.9, adult (HCC) on their problem list.  ----------------------------------------------------------------------------------- Patient reports no complaints.   Contractions: Not present. Vag. Bleeding: None.  Movement: Present. Leaking Fluid denies.  ----------------------------------------------------------------------------------- The following portions of the patient's history were reviewed and updated as appropriate: allergies, current medications, past family history, past medical history, past social history, past surgical history and problem list. Problem list updated.  Objective  Blood pressure 122/74, weight 248 lb (112.5 kg), last menstrual period 04/21/2019. Pregravid weight 255 lb (115.7 kg) Total Weight Gain -7 lb (-3.175 kg) Urinalysis: Urine Protein    Urine Glucose    Fetal Status: Fetal Heart Rate (bpm): 138 Fundal Height: 25 cm Movement: Present     General:  Alert, oriented and cooperative. Patient is in no acute distress.  Skin: Skin is warm and dry. No rash noted.   Cardiovascular: Normal heart rate noted  Respiratory: Normal respiratory effort, no problems with respiration noted  Abdomen: Soft, gravid, appropriate for gestational age. Pain/Pressure: Absent     Pelvic:  Cervical exam deferred        Extremities: Normal range of motion.  Edema: None  Mental Status: Normal mood and affect. Normal behavior. Normal judgment and thought content.   Assessment   29 y.o. G3P2002 at [redacted]w[redacted]d by  01/26/2020, by Last Menstrual Period presenting for routine prenatal visit  Plan   pregnancy Problems (from 06/07/19 to present)    Problem Noted Resolved    Supervision of high risk pregnancy, antepartum 06/07/2019 by [name], CNM No   Overview Addendum 08/12/2019  8:37 AM by Natale Milch, MD    Clinic [facility] Prenatal Labs  Dating  LMP = 8wk Blood type: O/Positive/-- (04/16 1452)   Genetic Screen NIPS: Normal xy Antibody:Negative (04/16 1452)  Anatomic [place]  Rubella: 1.86 (04/16 1452)  Varicella: immune  GTT Early:       77         Third trimester:  RPR: Non Reactive (04/16 1452)   Rhogam Not needed HBsAg: Negative (04/16 1452)   Vaccines TDAP:                       Flu Shot: HIV: Non Reactive (04/16 1452)   Baby Food Breast                               GBS:   Contraception Desires BTL: consent [ ]  Pap: 2019 negative  CBB     CS/VBAC NA BMI 43 at NOB: early 1 hr gtt, baby ASA, 3rd trimester APT  Support Person Tim          Previous Version   Obesity affecting pregnancy, antepartum 06/07/2019 by 08/07/2019, CNM No       Preterm labor symptoms and general obstetric precautions including but not limited to vaginal bleeding, contractions, leaking of fluid and fetal movement were reviewed in detail with the patient.    Return in about 4 weeks (around 11/04/2019) for 28 wk labs and rob.  01/04/2020, CNM 10/07/2019 9:02 AM

## 2019-11-04 ENCOUNTER — Other Ambulatory Visit: Payer: Medicaid Other

## 2019-11-04 ENCOUNTER — Encounter: Payer: Self-pay | Admitting: Obstetrics and Gynecology

## 2019-11-04 ENCOUNTER — Other Ambulatory Visit: Payer: Self-pay

## 2019-11-04 ENCOUNTER — Ambulatory Visit (INDEPENDENT_AMBULATORY_CARE_PROVIDER_SITE_OTHER): Payer: Medicaid Other | Admitting: Obstetrics and Gynecology

## 2019-11-04 VITALS — BP 126/74 | Wt 245.0 lb

## 2019-11-04 DIAGNOSIS — Z131 Encounter for screening for diabetes mellitus: Secondary | ICD-10-CM

## 2019-11-04 DIAGNOSIS — O0992 Supervision of high risk pregnancy, unspecified, second trimester: Secondary | ICD-10-CM

## 2019-11-04 DIAGNOSIS — Z3A28 28 weeks gestation of pregnancy: Secondary | ICD-10-CM

## 2019-11-04 DIAGNOSIS — Z113 Encounter for screening for infections with a predominantly sexual mode of transmission: Secondary | ICD-10-CM

## 2019-11-04 DIAGNOSIS — O0993 Supervision of high risk pregnancy, unspecified, third trimester: Secondary | ICD-10-CM

## 2019-11-04 DIAGNOSIS — Z130 Encounter for screening for diseases of the blood and blood-forming organs and certain disorders involving the immune mechanism: Secondary | ICD-10-CM

## 2019-11-04 DIAGNOSIS — O99213 Obesity complicating pregnancy, third trimester: Secondary | ICD-10-CM

## 2019-11-04 NOTE — Progress Notes (Signed)
Routine Prenatal Care Visit  Subjective  Abigail Mejia is a 29 y.o. G3P2002 at [redacted]w[redacted]d being seen today for ongoing prenatal care.  She is currently monitored for the following issues for this high-risk pregnancy and has Supervision of high risk pregnancy, antepartum; Obesity affecting pregnancy, antepartum; and BMI 40.0-44.9, adult (HCC) on their problem list.  ----------------------------------------------------------------------------------- Patient reports no complaints.   Contractions: Not present. Vag. Bleeding: None.  Movement: Present. Leaking Fluid denies.  ----------------------------------------------------------------------------------- The following portions of the patient's history were reviewed and updated as appropriate: allergies, current medications, past family history, past medical history, past social history, past surgical history and problem list. Problem list updated.  Objective  Blood pressure 126/74, weight 245 lb (111.1 kg), last menstrual period 04/21/2019. Pregravid weight 255 lb (115.7 kg) Total Weight Gain -10 lb (-4.536 kg) Urinalysis: Urine Protein    Urine Glucose    Fetal Status: Fetal Heart Rate (bpm): 135 Fundal Height: 28 cm Movement: Present     General:  Alert, oriented and cooperative. Patient is in no acute distress.  Skin: Skin is warm and dry. No rash noted.   Cardiovascular: Normal heart rate noted  Respiratory: Normal respiratory effort, no problems with respiration noted  Abdomen: Soft, gravid, appropriate for gestational age. Pain/Pressure: Absent     Pelvic:  Cervical exam deferred        Extremities: Normal range of motion.  Edema: None  Mental Status: Normal mood and affect. Normal behavior. Normal judgment and thought content.   Assessment   29 y.o. G3P2002 at [redacted]w[redacted]d by  01/26/2020, by Last Menstrual Period presenting for routine prenatal visit  Plan   pregnancy Problems (from 06/07/19 to present)    Problem Noted Resolved    Supervision of high risk pregnancy, antepartum 06/07/2019 by [name], CNM No   Overview Addendum 08/12/2019  8:37 AM by Natale Milch, MD    Clinic [facility] Prenatal Labs  Dating  LMP = 8wk Blood type: O/Positive/-- (04/16 1452)   Genetic Screen NIPS: Normal xy Antibody:Negative (04/16 1452)  Anatomic [place]  Rubella: 1.86 (04/16 1452)  Varicella: immune  GTT Early:       77         Third trimester:  RPR: Non Reactive (04/16 1452)   Rhogam Not needed HBsAg: Negative (04/16 1452)   Vaccines TDAP:                       Flu Shot: HIV: Non Reactive (04/16 1452)   Baby Food Breast                               GBS:   Contraception Desires BTL: consent [ ]  Pap: 2019 negative  CBB     CS/VBAC NA BMI 43 at NOB: early 1 hr gtt, baby ASA, 3rd trimester APT  Support Person Tim          Previous Version   Obesity affecting pregnancy, antepartum 06/07/2019 by 08/07/2019, CNM No       Preterm labor symptoms and general obstetric precautions including but not limited to vaginal bleeding, contractions, leaking of fluid and fetal movement were reviewed in detail with the patient. Please refer to After Visit Summary for other counseling recommendations.   - 28 week labs today -- signs MCD tubal ppw today  Return in about 2 weeks (around 11/18/2019) for Routine Prenatal Appointment.  11/20/2019, MD, [facility]  OB/GYN, [facility] 11/04/2019 8:32 AM

## 2019-11-05 LAB — 28 WEEK RH+PANEL
Basophils Absolute: 0 10*3/uL (ref 0.0–0.2)
Basos: 0 %
EOS (ABSOLUTE): 0.2 10*3/uL (ref 0.0–0.4)
Eos: 3 %
Gestational Diabetes Screen: 94 mg/dL (ref 65–139)
HIV Screen 4th Generation wRfx: NONREACTIVE
Hematocrit: 32.6 % — ABNORMAL LOW (ref 34.0–46.6)
Hemoglobin: 11 g/dL — ABNORMAL LOW (ref 11.1–15.9)
Immature Grans (Abs): 0 10*3/uL (ref 0.0–0.1)
Immature Granulocytes: 0 %
Lymphocytes Absolute: 1.4 10*3/uL (ref 0.7–3.1)
Lymphs: 20 %
MCH: 32.4 pg (ref 26.6–33.0)
MCHC: 33.7 g/dL (ref 31.5–35.7)
MCV: 96 fL (ref 79–97)
Monocytes Absolute: 0.5 10*3/uL (ref 0.1–0.9)
Monocytes: 8 %
Neutrophils Absolute: 4.8 10*3/uL (ref 1.4–7.0)
Neutrophils: 69 %
Platelets: 208 10*3/uL (ref 150–450)
RBC: 3.39 x10E6/uL — ABNORMAL LOW (ref 3.77–5.28)
RDW: 12.2 % (ref 11.7–15.4)
RPR Ser Ql: NONREACTIVE
WBC: 7.1 10*3/uL (ref 3.4–10.8)

## 2019-11-18 ENCOUNTER — Ambulatory Visit (INDEPENDENT_AMBULATORY_CARE_PROVIDER_SITE_OTHER): Payer: Medicaid Other | Admitting: Advanced Practice Midwife

## 2019-11-18 ENCOUNTER — Other Ambulatory Visit: Payer: Self-pay

## 2019-11-18 ENCOUNTER — Encounter: Payer: Self-pay | Admitting: Advanced Practice Midwife

## 2019-11-18 VITALS — BP 120/80 | Wt 241.0 lb

## 2019-11-18 DIAGNOSIS — Z23 Encounter for immunization: Secondary | ICD-10-CM

## 2019-11-18 DIAGNOSIS — Z3A30 30 weeks gestation of pregnancy: Secondary | ICD-10-CM

## 2019-11-18 DIAGNOSIS — O0990 Supervision of high risk pregnancy, unspecified, unspecified trimester: Secondary | ICD-10-CM

## 2019-11-18 DIAGNOSIS — O99210 Obesity complicating pregnancy, unspecified trimester: Secondary | ICD-10-CM

## 2019-11-18 LAB — POCT URINALYSIS DIPSTICK OB: Glucose, UA: NEGATIVE

## 2019-11-18 NOTE — Progress Notes (Signed)
  Routine Prenatal Care Visit  Subjective  Abigail Mejia is a 29 y.o. G3P2002 at [redacted]w[redacted]d being seen today for ongoing prenatal care.  She is currently monitored for the following issues for this high-risk pregnancy and has Supervision of high risk pregnancy, antepartum; Obesity affecting pregnancy, antepartum; and BMI 40.0-44.9, adult (HCC) on their problem list.  ----------------------------------------------------------------------------------- Patient reports no complaints.  She admits good appetite.  Contractions: Not present. Vag. Bleeding: None.  Movement: Present. Leaking Fluid denies.  ----------------------------------------------------------------------------------- The following portions of the patient's history were reviewed and updated as appropriate: allergies, current medications, past family history, past medical history, past social history, past surgical history and problem list. Problem list updated.  Objective  Blood pressure 120/80, weight 241 lb (109.3 kg), last menstrual period 04/21/2019. Pregravid weight 255 lb (115.7 kg) Total Weight Gain -14 lb (-6.35 kg) Urinalysis: Urine Protein Trace  Urine Glucose Negative  Fetal Status: Fetal Heart Rate (bpm): 145 Fundal Height: 31 cm Movement: Present     General:  Alert, oriented and cooperative. Patient is in no acute distress.  Skin: Skin is warm and dry. No rash noted.   Cardiovascular: Normal heart rate noted  Respiratory: Normal respiratory effort, no problems with respiration noted  Abdomen: Soft, gravid, appropriate for gestational age. Pain/Pressure: Absent     Pelvic:  Cervical exam deferred        Extremities: Normal range of motion.     Mental Status: Normal mood and affect. Normal behavior. Normal judgment and thought content.   Assessment   29 y.o. G3P2002 at [redacted]w[redacted]d by  01/26/2020, by Last Menstrual Period presenting for routine prenatal visit  Plan   pregnancy Problems (from 06/07/19 to present)     Problem Noted Resolved   Supervision of high risk pregnancy, antepartum 06/07/2019 by [name], CNM No   Overview Addendum 08/12/2019  8:37 AM by Natale Milch, MD    Clinic [facility] Prenatal Labs  Dating  LMP = 8wk Blood type: O/Positive/-- (04/16 1452)   Genetic Screen NIPS: Normal xy Antibody:Negative (04/16 1452)  Anatomic [place]  Rubella: 1.86 (04/16 1452)  Varicella: immune  GTT Early:   77         Third trimester:  RPR: Non Reactive (04/16 1452)   Rhogam Not needed HBsAg: Negative (04/16 1452)   Vaccines TDAP: 11/18/19                      Flu Shot: HIV: Non Reactive (04/16 1452)   Baby Food Breast                               GBS:   Contraception Desires BTL: consent [signed 9/3] Pap: 2019 negative  CBB     CS/VBAC NA BMI 43 at NOB: early 1 hr gtt, baby ASA, 3rd trimester APT  Support Person Tim          Previous Version   Obesity affecting pregnancy, antepartum 06/07/2019 by [name], CNM No       Preterm labor symptoms and general obstetric precautions including but not limited to vaginal bleeding, contractions, leaking of fluid and fetal movement were reviewed in detail with the patient.   Return in about 2 weeks (around 12/02/2019) for rob.  [name], CNM 11/18/2019 8:31 AM

## 2019-12-01 ENCOUNTER — Ambulatory Visit (INDEPENDENT_AMBULATORY_CARE_PROVIDER_SITE_OTHER): Payer: Medicaid Other | Admitting: Obstetrics

## 2019-12-01 ENCOUNTER — Other Ambulatory Visit: Payer: Self-pay

## 2019-12-01 VITALS — BP 100/70 | Wt 239.0 lb

## 2019-12-01 DIAGNOSIS — O0990 Supervision of high risk pregnancy, unspecified, unspecified trimester: Secondary | ICD-10-CM

## 2019-12-01 DIAGNOSIS — Z3A32 32 weeks gestation of pregnancy: Secondary | ICD-10-CM

## 2019-12-01 LAB — POCT URINALYSIS DIPSTICK OB: Glucose, UA: NEGATIVE

## 2019-12-01 NOTE — Progress Notes (Signed)
C/o is keeping fluids down but not so much on keeping food down.rj

## 2019-12-01 NOTE — Progress Notes (Signed)
Routine Prenatal Care Visit  Subjective  Abigail Mejia is a 29 y.o. G3P2002 at [redacted]w[redacted]d being seen today for ongoing prenatal care.  She is currently monitored for the following issues for this high-risk pregnancy and has Supervision of high risk pregnancy, antepartum; Obesity affecting pregnancy, antepartum; and BMI 40.0-44.9, adult (HCC) on their problem list.  ----------------------------------------------------------------------------------- Patient reports heartburn, no bleeding, no contractions, no cramping and no leaking.  She has been taking the Protonix irregularly; Still struggles with heartburn. Ha lost 16 lbs this pregnancy,, but reassured by this provider that the baby is growing well.  .  .   Pincus Large Fluid denies.  ----------------------------------------------------------------------------------- The following portions of the patient's history were reviewed and updated as appropriate: allergies, current medications, past family history, past medical history, past social history, past surgical history and problem list. Problem list updated.  Objective  Blood pressure 100/70, weight 239 lb (108.4 kg), last menstrual period 04/21/2019. Pregravid weight 255 lb (115.7 kg) Total Weight Gain -16 lb (-7.258 kg) Urinalysis: Urine Protein Trace  Urine Glucose Negative  Fetal Status:           General:  Alert, oriented and cooperative. Patient is in no acute distress.  Skin: Skin is warm and dry. No rash noted.   Cardiovascular: Normal heart rate noted  Respiratory: Normal respiratory effort, no problems with respiration noted  Abdomen: Soft, gravid, appropriate for gestational age.       Pelvic:  Cervical exam deferred        Extremities: Normal range of motion.     Mental Status: Normal mood and affect. Normal behavior. Normal judgment and thought content.   Assessment   29 y.o. G3P2002 at [redacted]w[redacted]d by  01/26/2020, by Last Menstrual Period presenting for routine prenatal visit  Plan    pregnancy Problems (from 06/07/19 to present)    Problem Noted Resolved   Supervision of high risk pregnancy, antepartum 06/07/2019 by [name], CNM No   Overview Addendum 11/18/2019  8:37 AM by [name], CNM    [facility]inic [facility] Prenatal Labs  Dating  LMP = 8wk Blood type: O/Positive/-- (04/16 1452)   Genetic Screen NIPS: Normal xy Antibody:Negative (04/16 1452)  Anatomic [place]  Rubella: 1.86 (04/16 1452)  Varicella: immune  GTT Early:       77         Third trimester:  RPR: Non Reactive (04/16 1452)   Rhogam Not needed HBsAg: Negative (04/16 1452)   Vaccines TDAP: 11/18/19                     Flu Shot: HIV: Non Reactive (04/16 1452)   Baby Food Breast                               GBS:   Contraception Desires BTL: consent [signed 9/3] Pap: 2019 negative  CBB     CS/VBAC NA BMI 43 at NOB: early 1 hr gtt, baby ASA, 3rd trimester APT  Support Person Tim          Previous Version   Obesity affecting pregnancy, antepartum 06/07/2019 by [name], CNM No       Term labor symptoms and general obstetric precautions including but not limited to vaginal bleeding, contractions, leaking of fluid and fetal movement were reviewed in detail with the patient. Please refer to After Visit Summary for other counseling recommendations.  Discussed proton pump inhibitors, and the  need to take them daily. Suggested less liquids with her meals, using Tums or Maalox for acid reflux.  Return in about 2 weeks (around 12/15/2019).  [name], CNM  12/01/2019 5:27 PM

## 2019-12-14 ENCOUNTER — Encounter: Payer: Self-pay | Admitting: Advanced Practice Midwife

## 2019-12-14 ENCOUNTER — Other Ambulatory Visit: Payer: Self-pay

## 2019-12-14 ENCOUNTER — Ambulatory Visit (INDEPENDENT_AMBULATORY_CARE_PROVIDER_SITE_OTHER): Payer: Medicaid Other | Admitting: Advanced Practice Midwife

## 2019-12-14 VITALS — BP 102/62 | HR 73 | Wt 239.0 lb

## 2019-12-14 DIAGNOSIS — O99213 Obesity complicating pregnancy, third trimester: Secondary | ICD-10-CM

## 2019-12-14 DIAGNOSIS — Z3A33 33 weeks gestation of pregnancy: Secondary | ICD-10-CM

## 2019-12-14 DIAGNOSIS — O0993 Supervision of high risk pregnancy, unspecified, third trimester: Secondary | ICD-10-CM

## 2019-12-14 NOTE — Progress Notes (Signed)
  Routine Prenatal Care Visit  Subjective  Abigail Mejia is a 29 y.o. G3P2002 at [redacted]w[redacted]d being seen today for ongoing prenatal care.  She is currently monitored for the following issues for this high-risk pregnancy and has Supervision of high risk pregnancy, antepartum; Obesity affecting pregnancy, antepartum; and BMI 40.0-44.9, adult (HCC) on their problem list.  ----------------------------------------------------------------------------------- Patient reports no complaints.   Contractions: Not present. Vag. Bleeding: None.  Movement: Present. Leaking Fluid denies.  ----------------------------------------------------------------------------------- The following portions of the patient's history were reviewed and updated as appropriate: allergies, current medications, past family history, past medical history, past social history, past surgical history and problem list. Problem list updated.  Objective  Blood pressure 102/62, pulse 73, weight 239 lb (108.4 kg), last menstrual period 04/21/2019. Pregravid weight 255 lb (115.7 kg) Total Weight Gain -16 lb (-7.258 kg) Urinalysis: Urine Protein    Urine Glucose    Fetal Status: Fetal Heart Rate (bpm): 132 Fundal Height: 34 cm Movement: Present     General:  Alert, oriented and cooperative. Patient is in no acute distress.  Skin: Skin is warm and dry. No rash noted.   Cardiovascular: Normal heart rate noted  Respiratory: Normal respiratory effort, no problems with respiration noted  Abdomen: Soft, gravid, appropriate for gestational age. Pain/Pressure: Absent     Pelvic:  Cervical exam deferred        Extremities: Normal range of motion.  Edema: None  Mental Status: Normal mood and affect. Normal behavior. Normal judgment and thought content.   Assessment   29 y.o. [id number] at [redacted]w[redacted]d by  01/26/2020, by Last Menstrual Period presenting for routine prenatal visit  Plan   pregnancy Problems (from 06/07/19 to present)    Problem Noted Resolved    Supervision of high risk pregnancy, antepartum 06/07/2019 by [name], CNM No   Overview Addendum 11/18/2019  8:37 AM by [name], CNM    Clinic [facility] Prenatal Labs  Dating  LMP = 8wk Blood type: O/Positive/-- (04/16 1452)   Genetic Screen NIPS: Normal xy Antibody:Negative (04/16 1452)  Anatomic [place]  Rubella: 1.86 (04/16 1452)  Varicella: immune  GTT Early:       77         Third trimester:  RPR: Non Reactive (04/16 1452)   Rhogam Not needed HBsAg: Negative (04/16 1452)   Vaccines TDAP: 11/18/19                     Flu Shot: HIV: Non Reactive (04/16 1452)   Baby Food Breast                               GBS:   Contraception Desires BTL: consent [signed 9/3] Pap: 2019 negative  CBB     CS/VBAC NA BMI 43 at NOB: early 1 hr gtt, baby ASA, 3rd trimester APT  Support Person Tim          Previous Version   Obesity affecting pregnancy, antepartum 06/07/2019 by [name], CNM No       Preterm labor symptoms and general obstetric precautions including but not limited to vaginal bleeding, contractions, leaking of fluid and fetal movement were reviewed in detail with the patient.   Return in about 2 weeks (around 12/28/2019) for rob.  [name], CNM 12/14/2019 4:24 PM

## 2019-12-14 NOTE — Patient Instructions (Signed)
34 Week Obstetrics Instructions  PRE-REGISTRATION  Please plan to pre-register at the hospital during the next week.  You can complete the form only by visiting [email or web address]   If you have any questions concerning this please contact the main number at [facility] [phone]  SIGNS OF LABOR  There is never an absolute way for an individual to determine if they ar truly in labor unless they are seen in the office or at the hospital.  However, please follow these guidelines in order to determine if you may potentially be in labor:  A.  Contractions 5 minutes apart for an hour (first baby)  B. Contractions 5 minutes apart for 20-30 minutes (if you have had a baby)  C. If you have any doubts   -If A, B, or C occur, please go to the hospital to be seen in Labor & Delivery [facility] [phone]-the nurse will check you and contact us. Please report to the hospital through the Emergency Room entrance and they will transport you to labor and delivery.  SPONTANEOUS RUPTURE OF MEMBRANES ("Water Breaks")  This is usually evidenced by a gush of fluid from the vagina.  If this occurs during office hours (and you are uncertain if your water has just broken), schedule ana appointment to come into the office to be checked.  After office hours and on weekends, go to the labor and delivery unit where the nurse will check you and contact us.  VAGINAL BLEEDING  A.  "Bloody Show" - this is a thick blood streaked vaginal discharge.  It is normal and should cause no alarm.  B. Heavy vaginal bleeding (similar to that of a period or heavier).  If this occurs, you should go IMMEDIATELY to the labor and delivery unit.  WHEN TO CALL  1. Routine Questions: please make a list of questions you may have and merely ask them at your next visit in the office.  2. Urgent or Worrisome Questions:  During office hours, please  call us at [phone] to leave a message with the nurse and/or provider for assistance.  After hours, the answering system will direct you as to which provider is on call and if your concern requires that they be paged.  If so, follow the instructions on the voicemail and the provider will contact you back at their earliest convenience.  3. Labor or Rupture of Membranes:  This will require an examination.  Please contact the office to schedule an appointment for exam.  After office hours and on the weekends, go to the Labor and Delivery unit at the hospital where the nurse will check you and call us.  You do not need to notify us at night that you are coming.  INSURANCE REGULATIONS/HOSPITAL DISCHARGE It is very important that you understand that not all delivers are the same.  Typically, a vaginal delivery requires a 2 day stay at the hospital and a cesarean delivery requires 4 days in the hospital for recovery.  It is your responsibility to know what time frame your insurance suggests you stay in the hospital for coverage purposes.  However, one must keep in mind that providers use their own judgment and discretion when determining when an individual is able to be discharged.    

## 2019-12-14 NOTE — Progress Notes (Signed)
poc34 wk instructions/FKC's given.

## 2019-12-16 ENCOUNTER — Encounter: Payer: Medicaid Other | Admitting: Obstetrics and Gynecology

## 2019-12-30 ENCOUNTER — Encounter: Payer: Self-pay | Admitting: Obstetrics and Gynecology

## 2019-12-30 ENCOUNTER — Ambulatory Visit (INDEPENDENT_AMBULATORY_CARE_PROVIDER_SITE_OTHER): Payer: Medicaid Other | Admitting: Obstetrics and Gynecology

## 2019-12-30 ENCOUNTER — Other Ambulatory Visit (HOSPITAL_COMMUNITY)
Admission: RE | Admit: 2019-12-30 | Discharge: 2019-12-30 | Disposition: A | Discharge status: Home / Self Care | Payer: Medicaid Other | Source: Ambulatory Visit | Attending: Obstetrics and Gynecology | Admitting: Obstetrics and Gynecology

## 2019-12-30 ENCOUNTER — Other Ambulatory Visit: Payer: Self-pay

## 2019-12-30 VITALS — BP 110/70 | Ht 67.0 in | Wt 237.2 lb

## 2019-12-30 DIAGNOSIS — Z3A36 36 weeks gestation of pregnancy: Secondary | ICD-10-CM

## 2019-12-30 DIAGNOSIS — O0990 Supervision of high risk pregnancy, unspecified, unspecified trimester: Secondary | ICD-10-CM | POA: Diagnosis present

## 2019-12-30 DIAGNOSIS — O99210 Obesity complicating pregnancy, unspecified trimester: Secondary | ICD-10-CM

## 2019-12-30 NOTE — Progress Notes (Signed)
Routine Prenatal Care Visit  Subjective  Abigail Mejia is a 29 y.o. [id number] at [redacted]w[redacted]d being seen today for ongoing prenatal care.  She is currently monitored for the following issues for this high-risk pregnancy and has Supervision of high risk pregnancy, antepartum; Obesity affecting pregnancy, antepartum; and BMI 40.0-44.9, adult (HCC) on their problem list.  ----------------------------------------------------------------------------------- Patient reports no complaints.   Contractions: Not present. Vag. Bleeding: None.  Movement: Present. Denies leaking of fluid.  ----------------------------------------------------------------------------------- The following portions of the patient's history were reviewed and updated as appropriate: allergies, current medications, past family history, past medical history, past social history, past surgical history and problem list. Problem list updated.   Objective  Blood pressure 110/70, height 5\' 7"  (1.702 m), weight 237 lb 3.2 oz (107.6 kg), last menstrual period 04/21/2019. Pregravid weight 255 lb (115.7 kg) Total Weight Gain -17 lb 12.8 oz (-8.074 kg) Urinalysis:      Fetal Status: Fetal Heart Rate (bpm): 144 Fundal Height: 35 cm Movement: Present  Presentation: Vertex  General:  Alert, oriented and cooperative. Patient is in no acute distress.  Skin: Skin is warm and dry. No rash noted.   Cardiovascular: Normal heart rate noted  Respiratory: Normal respiratory effort, no problems with respiration noted  Abdomen: Soft, gravid, appropriate for gestational age. Pain/Pressure: Absent     Pelvic:  Cervical exam deferred Dilation: 1 Effacement (%): 50 Station: -3  Extremities: Normal range of motion.  Edema: None  Mental Status: Normal mood and affect. Normal behavior. Normal judgment and thought content.     Assessment   29 y.o. 04/23/2019 at [redacted]w[redacted]d by  01/26/2020, by Last Menstrual Period presenting for routine prenatal visit  Plan    pregnancy Problems (from 06/07/19 to present)    Problem Noted Resolved   Supervision of high risk pregnancy, antepartum 06/07/2019 by 08/07/2019, CNM No   Overview Addendum 12/30/2019  8:41 AM by 01/01/2020, MD    [facility]inic [facility] Prenatal Labs  Dating  LMP = 8wk Blood type: O/Positive/-- (04/16 1452)   Genetic Screen NIPS: Normal xy Antibody:Negative (04/16 1452)  Anatomic [birth year]1993 complete Rubella: 1.86 (04/16 1452)  Varicella: immune  GTT Early:       77         Third trimester: 94 RPR: Non Reactive (09/03 0939)   Rhogam Not needed HBsAg: Negative (04/16 1452)   Vaccines TDAP: 11/18/19                     Flu Shot: HIV: Non Reactive (09/03 0939)   Baby Food Breast                               GBS:   Contraception Desires BTL: consent [signed 9/3] Pap: 2019 negative  CBB     CS/VBAC NA BMI 43 at NOB: early 1 hr gtt, baby ASA, 3rd trimester APT  Support Person Tim          Previous Version   Obesity affecting pregnancy, antepartum 06/07/2019 by 08/07/2019, CNM No       GBS/ GC/ CT today Discussed reasons to come to the hospital Will start antenatal testing next week with NST/ AFI weekly for obesity in pregnancy Growth [name] next week for obesity in pregnancy.   Gestational age appropriate obstetric precautions including but not limited to vaginal bleeding, contractions, leaking of fluid and fetal movement were reviewed in detail with  the patient.    Return in about 1 week (around 01/06/2020) for ROB/ NST/ AFI-   NST/AFI weekly for next 3 weeks.  Natale Milch MD [facility], [facility] 12/30/2019, 8:53 AM

## 2019-12-30 NOTE — Patient Instructions (Signed)
Third Trimester of Pregnancy The third trimester is from week 28 through week 40 (months 7 through 9). The third trimester is a time when the unborn baby (fetus) is growing rapidly. At the end of the ninth month, the fetus is about 20 inches in length and weighs 6-10 pounds. Body changes during your third trimester Your body will continue to go through many changes during pregnancy. The changes vary from woman to woman. During the third trimester:  Your weight will continue to increase. You can expect to gain 25-35 pounds (11-16 kg) by the end of the pregnancy.  You may begin to get stretch marks on your hips, abdomen, and breasts.  You may urinate more often because the fetus is moving lower into your pelvis and pressing on your bladder.  You may develop or continue to have heartburn. This is caused by increased hormones that slow down muscles in the digestive tract.  You may develop or continue to have constipation because increased hormones slow digestion and cause the muscles that push waste through your intestines to relax.  You may develop hemorrhoids. These are swollen veins (varicose veins) in the rectum that can itch or be painful.  You may develop swollen, bulging veins (varicose veins) in your legs.  You may have increased body aches in the pelvis, back, or thighs. This is due to weight gain and increased hormones that are relaxing your joints.  You may have changes in your hair. These can include thickening of your hair, rapid growth, and changes in texture. Some women also have hair loss during or after pregnancy, or hair that feels dry or thin. Your hair will most likely return to normal after your baby is born.  Your breasts will continue to grow and they will continue to become tender. A yellow fluid (colostrum) may leak from your breasts. This is the first milk you are producing for your baby.  Your belly button may stick out.  You may notice more swelling in your hands,  face, or ankles.  You may have increased tingling or numbness in your hands, arms, and legs. The skin on your belly may also feel numb.  You may feel short of breath because of your expanding uterus.  You may have more problems sleeping. This can be caused by the size of your belly, increased need to urinate, and an increase in your body's metabolism.  You may notice the fetus "dropping," or moving lower in your abdomen (lightening).  You may have increased vaginal discharge.  You may notice your joints feel loose and you may have pain around your pelvic bone. What to expect at prenatal visits You will have prenatal exams every 2 weeks until week 36. Then you will have weekly prenatal exams. During a routine prenatal visit:  You will be weighed to make sure you and the baby are growing normally.  Your blood pressure will be taken.  Your abdomen will be measured to track your baby's growth.  The fetal heartbeat will be listened to.  Any test results from the previous visit will be discussed.  You may have a cervical check near your due date to see if your cervix has softened or thinned (effaced).  You will be tested for Group B streptococcus. This happens between 35 and 37 weeks. Your health care provider may ask you:  What your birth plan is.  How you are feeling.  If you are feeling the baby move.  If you have had any abnormal   symptoms, such as leaking fluid, bleeding, severe headaches, or abdominal cramping.  If you are using any tobacco products, including cigarettes, chewing tobacco, and electronic cigarettes.  If you have any questions. Other tests or screenings that may be performed during your third trimester include:  Blood tests that check for low iron levels (anemia).  Fetal testing to check the health, activity level, and growth of the fetus. Testing is done if you have certain medical conditions or if there are problems during the pregnancy.  Nonstress test  (NST). This test checks the health of your baby to make sure there are no signs of problems, such as the baby not getting enough oxygen. During this test, a belt is placed around your belly. The baby is made to move, and its heart rate is monitored during movement. What is false labor? False labor is a condition in which you feel small, irregular tightenings of the muscles in the womb (contractions) that usually go away with rest, changing position, or drinking water. These are called Braxton Hicks contractions. Contractions may last for hours, days, or even weeks before true labor sets in. If contractions come at regular intervals, become more frequent, increase in intensity, or become painful, you should see your health care provider. What are the signs of labor?  Abdominal cramps.  Regular contractions that start at 10 minutes apart and become stronger and more frequent with time.  Contractions that start on the top of the uterus and spread down to the lower abdomen and back.  Increased pelvic pressure and dull back pain.  A watery or bloody mucus discharge that comes from the vagina.  Leaking of amniotic fluid. This is also known as your "water breaking." It could be a slow trickle or a gush. Let your health care provider know if it has a color or strange odor. If you have any of these signs, call your health care provider right away, even if it is before your due date. Follow these instructions at home: Medicines  Follow your health care provider's instructions regarding medicine use. Specific medicines may be either safe or unsafe to take during pregnancy.  Take a prenatal vitamin that contains at least 600 micrograms (mcg) of folic acid.  If you develop constipation, try taking a stool softener if your health care provider approves. Eating and drinking   Eat a balanced diet that includes fresh fruits and vegetables, whole grains, good sources of protein such as meat, eggs, or tofu,  and low-fat dairy. Your health care provider will help you determine the amount of weight gain that is right for you.  Avoid raw meat and uncooked cheese. These carry germs that can cause birth defects in the baby.  If you have low calcium intake from food, talk to your health care provider about whether you should take a daily calcium supplement.  Eat four or five small meals rather than three large meals a day.  Limit foods that are high in fat and processed sugars, such as fried and sweet foods.  To prevent constipation: ? Drink enough fluid to keep your urine clear or pale yellow. ? Eat foods that are high in fiber, such as fresh fruits and vegetables, whole grains, and beans. Activity  Exercise only as directed by your health care provider. Most women can continue their usual exercise routine during pregnancy. Try to exercise for 30 minutes at least 5 days a week. Stop exercising if you experience uterine contractions.  Avoid heavy lifting.  Do   not exercise in extreme heat or humidity, or at high altitudes.  Wear low-heel, comfortable shoes.  Practice good posture.  You may continue to have sex unless your health care provider tells you otherwise. Relieving pain and discomfort  Take frequent breaks and rest with your legs elevated if you have leg cramps or low back pain.  Take warm sitz baths to soothe any pain or discomfort caused by hemorrhoids. Use hemorrhoid cream if your health care provider approves.  Wear a good support bra to prevent discomfort from breast tenderness.  If you develop varicose veins: ? Wear support pantyhose or compression stockings as told by your healthcare provider. ? Elevate your feet for 15 minutes, 3-4 times a day. Prenatal care  Write down your questions. Take them to your prenatal visits.  Keep all your prenatal visits as told by your health care provider. This is important. Safety  Wear your seat belt at all times when driving.  Make  a list of emergency phone numbers, including numbers for family, friends, the hospital, and police and fire departments. General instructions  Avoid cat litter boxes and soil used by cats. These carry germs that can cause birth defects in the baby. If you have a cat, ask someone to clean the litter box for you.  Do not travel far distances unless it is absolutely necessary and only with the approval of your health care provider.  Do not use hot tubs, steam rooms, or saunas.  Do not drink alcohol.  Do not use any products that contain nicotine or tobacco, such as cigarettes and e-cigarettes. If you need help quitting, ask your health care provider.  Do not use any medicinal herbs or unprescribed drugs. These chemicals affect the formation and growth of the baby.  Do not douche or use tampons or scented sanitary pads.  Do not cross your legs for long periods of time.  To prepare for the arrival of your baby: ? Take prenatal classes to understand, practice, and ask questions about labor and delivery. ? Make a trial run to the hospital. ? Visit the hospital and tour the maternity area. ? Arrange for maternity or paternity leave through employers. ? Arrange for family and friends to take care of pets while you are in the hospital. ? Purchase a rear-facing car seat and make sure you know how to install it in your car. ? Pack your hospital bag. ? Prepare the baby's nursery. Make sure to remove all pillows and stuffed animals from the baby's crib to prevent suffocation.  Visit your dentist if you have not gone during your pregnancy. Use a soft toothbrush to brush your teeth and be gentle when you floss. Contact a health care provider if:  You are unsure if you are in labor or if your water has broken.  You become dizzy.  You have mild pelvic cramps, pelvic pressure, or nagging pain in your abdominal area.  You have lower back pain.  You have persistent nausea, vomiting, or  diarrhea.  You have an unusual or bad smelling vaginal discharge.  You have pain when you urinate. Get help right away if:  Your water breaks before 37 weeks.  You have regular contractions less than 5 minutes apart before 37 weeks.  You have a fever.  You are leaking fluid from your vagina.  You have spotting or bleeding from your vagina.  You have severe abdominal pain or cramping.  You have rapid weight loss or weight gain.  You have   shortness of breath with chest pain.  You notice sudden or extreme swelling of your face, hands, ankles, feet, or legs.  Your baby makes fewer than 10 movements in 2 hours.  You have severe headaches that do not go away when you take medicine.  You have vision changes. Summary  The third trimester is from week 28 through week 40, months 7 through 9. The third trimester is a time when the unborn baby (fetus) is growing rapidly.  During the third trimester, your discomfort may increase as you and your baby continue to gain weight. You may have abdominal, leg, and back pain, sleeping problems, and an increased need to urinate.  During the third trimester your breasts will keep growing and they will continue to become tender. A yellow fluid (colostrum) may leak from your breasts. This is the first milk you are producing for your baby.  False labor is a condition in which you feel small, irregular tightenings of the muscles in the womb (contractions) that eventually go away. These are called Braxton Hicks contractions. Contractions may last for hours, days, or even weeks before true labor sets in.  Signs of labor can include: abdominal cramps; regular contractions that start at 10 minutes apart and become stronger and more frequent with time; watery or bloody mucus discharge that comes from the vagina; increased pelvic pressure and dull back pain; and leaking of amniotic fluid. This information is not intended to replace advice given to you by your  health care provider. Make sure you discuss any questions you have with your health care provider. Document Revised: 06/10/2018 Document Reviewed: 03/25/2016 Elsevier Patient Education  2020 [employer]  

## 2020-01-02 LAB — CERVICOVAGINAL ANCILLARY ONLY
Chlamydia: NEGATIVE
Comment: NEGATIVE
Comment: NORMAL
Neisseria Gonorrhea: NEGATIVE

## 2020-01-03 ENCOUNTER — Encounter: Payer: Self-pay | Admitting: Obstetrics and Gynecology

## 2020-01-03 ENCOUNTER — Ambulatory Visit (INDEPENDENT_AMBULATORY_CARE_PROVIDER_SITE_OTHER): Payer: Medicaid Other | Admitting: Obstetrics and Gynecology

## 2020-01-03 ENCOUNTER — Other Ambulatory Visit: Payer: Self-pay

## 2020-01-03 ENCOUNTER — Ambulatory Visit (INDEPENDENT_AMBULATORY_CARE_PROVIDER_SITE_OTHER): Payer: Medicaid Other

## 2020-01-03 VITALS — BP 108/66 | Wt 236.0 lb

## 2020-01-03 DIAGNOSIS — Z3A36 36 weeks gestation of pregnancy: Secondary | ICD-10-CM

## 2020-01-03 DIAGNOSIS — O0993 Supervision of high risk pregnancy, unspecified, third trimester: Secondary | ICD-10-CM

## 2020-01-03 DIAGNOSIS — O99213 Obesity complicating pregnancy, third trimester: Secondary | ICD-10-CM

## 2020-01-03 DIAGNOSIS — O0990 Supervision of high risk pregnancy, unspecified, unspecified trimester: Secondary | ICD-10-CM

## 2020-01-03 LAB — CULTURE, BETA STREP (GROUP B ONLY): Strep Gp B Culture: NEGATIVE

## 2020-01-03 NOTE — Progress Notes (Signed)
Routine Prenatal Care Visit  Subjective  Abigail Mejia is a 29 y.o. [id number] at [redacted]w[redacted]d being seen today for ongoing prenatal care.  She is currently monitored for the following issues for this high-risk pregnancy and has Supervision of high risk pregnancy, antepartum; Obesity affecting pregnancy, antepartum; and BMI 40.0-44.9, adult (HCC) on their problem list.  ----------------------------------------------------------------------------------- Patient reports no complaints.   Contractions: Not present. Vag. Bleeding: None.  Movement: Present. Leaking Fluid denies.  Growth u/s today 53rd %ile, AFI 11.8 cm ----------------------------------------------------------------------------------- The following portions of the patient's history were reviewed and updated as appropriate: allergies, current medications, past family history, past medical history, past social history, past surgical history and problem list. Problem list updated.  Objective  Blood pressure 108/66, weight 236 lb (107 kg), last menstrual period 04/21/2019. Pregravid weight 255 lb (115.7 kg) Total Weight Gain -19 lb (-8.618 kg) Urinalysis: Urine Protein    Urine Glucose    Fetal Status: Fetal Heart Rate (bpm): 135   Movement: Present  Presentation: Vertex  General:  Alert, oriented and cooperative. Patient is in no acute distress.  Skin: Skin is warm and dry. No rash noted.   Cardiovascular: Normal heart rate noted  Respiratory: Normal respiratory effort, no problems with respiration noted  Abdomen: Soft, gravid, appropriate for gestational age. Pain/Pressure: Absent     Pelvic:  Cervical exam deferred        Extremities: Normal range of motion.  Edema: None  Mental Status: Normal mood and affect. Normal behavior. Normal judgment and thought content.   NST: Baseline FHR: 135 beats/min Variability: moderate Accelerations: present Decelerations: absent Tocometry: not done  Interpretation:  INDICATIONS: morbid obesity  in pregnancy RESULTS:  A NST procedure was performed with FHR monitoring and a normal baseline established, appropriate time of 20-40 minutes of evaluation, and accels >2 seen w 15x15 characteristics.  Results show a REACTIVE NST.    Assessment   29 y.o. [id number] at [redacted]w[redacted]d by  01/26/2020, by Last Menstrual Period presenting for routine prenatal visit  Plan   pregnancy Problems (from 06/07/19 to present)    Problem Noted Resolved   Supervision of high risk pregnancy, antepartum 06/07/2019 by [name], CNM No   Overview Addendum 01/03/2020  9:20 AM by Conard Novak, MD    Clinic [facility] Prenatal Labs  Dating  LMP = 8wk Blood type: O/Positive/-- (04/16 1452)   Genetic Screen NIPS: Normal xy Antibody:Negative (04/16 1452)  Anatomic [place] complete Rubella: 1.86 (04/16 1452)  Varicella: immune  GTT Early:       77         Third trimester: 94 RPR: Non Reactive (09/03 0939)   Rhogam Not needed HBsAg: Negative (04/16 1452)   Vaccines TDAP: 11/18/19                     Flu Shot: HIV: Non Reactive (09/03 0939)   Baby Food Breast                               [id number]/-- (10/29 1645)  Contraception Desires BTL: consent [signed 9/3] Pap: 2019 negative  CBB     CS/VBAC NA BMI 43 at NOB: early 1 hr gtt, baby ASA, 3rd trimester APT  Support Person Tim          Previous Version   Obesity affecting pregnancy, antepartum 06/07/2019 by [name], CNM No   Overview Addendum 01/03/2020  9:21 AM by  Conard Novak, MD    -- patient has lost ~20 pounds during pregnancy - BMI @ 36wks=37 BMI >=40 [x]  early 1h gtt -  [x]  u/s for dating [x]   [x]  bASA (>12 weeks) [x]  Growth u/s 28 [ ] , 32 [ ] , 36 weeks [x]  [ ]  NST/AFI weekly 34+ weeks (34[] ,35[] ,36[x] , 37[] , 38[] , 39[] , 40[] ) [ ]  IOL by 41 weeks (scheduled, prn [] )      Previous Version       Preterm labor symptoms and general obstetric precautions including but not limited to vaginal bleeding, contractions, leaking of fluid and fetal  movement were reviewed in detail with the patient. Please refer to After Visit Summary for other counseling recommendations.   Return in about 1 week (around 01/10/2020) for U/S for AFI, routine prenatal w NST.   , MD, OB/GYN, [facility] 01/03/2020 9:35 AM

## 2020-01-06 ENCOUNTER — Encounter: Payer: Self-pay | Admitting: Obstetrics & Gynecology

## 2020-01-06 ENCOUNTER — Ambulatory Visit (INDEPENDENT_AMBULATORY_CARE_PROVIDER_SITE_OTHER): Payer: Medicaid Other | Admitting: Obstetrics & Gynecology

## 2020-01-06 ENCOUNTER — Other Ambulatory Visit: Payer: Self-pay

## 2020-01-06 VITALS — BP 110/60 | Wt 236.0 lb

## 2020-01-06 DIAGNOSIS — O99213 Obesity complicating pregnancy, third trimester: Secondary | ICD-10-CM | POA: Diagnosis not present

## 2020-01-06 DIAGNOSIS — O0993 Supervision of high risk pregnancy, unspecified, third trimester: Secondary | ICD-10-CM | POA: Diagnosis not present

## 2020-01-06 DIAGNOSIS — Z3A37 37 weeks gestation of pregnancy: Secondary | ICD-10-CM

## 2020-01-06 LAB — POCT URINALYSIS DIPSTICK OB
Glucose, UA: NEGATIVE
POC,PROTEIN,UA: NEGATIVE

## 2020-01-06 NOTE — Progress Notes (Signed)
  Subjective  Fetal Movement? yes Contractions? no Leaking Fluid? no Vaginal Bleeding? no  Objective  BP 110/60   Wt 236 lb (107 kg)   LMP 04/21/2019 (Exact Date)   BMI 36.96 kg/m  General: NAD Pumonary: no increased work of breathing Abdomen: gravid, non-tender Extremities: no edema Psychiatric: mood appropriate, affect full  Assessment  29 y.o. [id number] at [redacted]w[redacted]d by  01/26/2020, by Last Menstrual Period presenting for routine prenatal visit  Plan   Problem List Items Addressed This Visit    None    Visit Diagnoses    [redacted] weeks gestation of pregnancy    -  Primary   Relevant Orders   POC Urinalysis Dipstick OB (Completed)   Supervision of high risk pregnancy in third trimester       Obesity affecting pregnancy in third trimester          pregnancy Problems (from 06/07/19 to present)    Problem Noted Resolved   Supervision of high risk pregnancy, antepartum     Overview Addendum 01/03/2020  9:20 AM by Conard Novak, MD    Clinic [facility] Prenatal Labs  Dating  LMP = 8wk Blood type: O/Positive/-- (04/16 1452)   Genetic Screen NIPS: Normal xy Antibody:Negative (04/16 1452)  Anatomic [place] complete Rubella: 1.86 (04/16 1452)  Varicella: immune  GTT Early:       77         Third trimester: 94 RPR: Non Reactive (09/03 0939)   Rhogam Not needed HBsAg: Negative (04/16 1452)   Vaccines TDAP: 11/18/19                     Flu Shot: HIV: Non Reactive (09/03 0939)   Baby Food Breast                               [id number]/-- (10/29 1645)  Contraception Desires BTL: consent [signed 9/3] Pap: 2019 negative  CBB     CS/VBAC NA BMI 43 at NOB: early 1 hr gtt, baby ASA, 3rd trimester APT  Support Person Tim          Previous Version   Obesity affecting pregnancy, antepartum 06/07/2019 by [name], CNM No   Overview Addendum 01/03/2020  9:21 AM by Conard Novak, MD    -- patient has lost ~20 pounds during pregnancy - BMI @ 36wks=37 BMI >=40 [x]  early 1h gtt -  [x]   u/s for dating [x]   [x]  bASA (>12 weeks) [x]  Growth u/s 28 [ ] , 32 [ ] , 36 weeks [x]  [x ] NST/AFI weekly 34+ weeks (34[] ,35[] ,36[x] , 37[] , 38[] , 39[] , 40[] ) [ ]  IOL by 41 weeks (scheduled, prn [] )     A NST procedure was performed with FHR monitoring and a normal baseline established, appropriate time of 20-40 minutes of evaluation, and accels >2 seen w 15x15 characteristics.  Results show a REACTIVE NST.  PNV, [facility], Labor precautions    , MD, Ob/Gyn, [facility] 01/06/2020  10:22 AM

## 2020-01-06 NOTE — Patient Instructions (Signed)
Surgery to Prevent Pregnancy Female sterilization is surgery to prevent pregnancy. In this surgery, the fallopian tubes are either blocked or closed off. When the fallopian tubes are closed, the eggs that the ovaries release cannot enter the uterus, sperm cannot reach the eggs, and you cannot get pregnant. Sterilization is permanent. It should only be done if you are sure that you do not want to be able to have children. What are the sterilization surgery options? There are several kinds of female sterilization surgeries. They include:  Laparoscopic tubal ligation. In this surgery, the fallopian tubes are tied off, sealed with heat, or blocked with a clip, ring, or clamp. A small portion of each fallopian tube may also be removed. This surgery is done through several small cuts (incisions) with special instruments that are inserted into your abdomen.  Postpartum tubal ligation. This is also called a mini-laparotomy. This surgery is done right after childbirth or 1 or 2 days after childbirth. In this surgery, the fallopian tubes are tied off, sealed with heat, or blocked with a clip, ring, or clamp. A small portion of each fallopian tube may also be removed. The surgery is done through a single incision in the abdomen.  Tubal ligation during a C-section. In this surgery, the fallopian tubes are tied off, sealed with heat, or blocked with a clip, ring, or clamp. A small portion of each fallopian tube may also be removed. The surgery is done at the same time as a C-section delivery. Is sterilization safe? Generally, sterilization is safe. Complications are rare. However, there are risks. They include:  Bleeding.  Infection.  Reaction to medicine used during the procedure.  Injury to surrounding organs.  Failure of the procedure. How effective is sterilization? Sterilization is nearly 100% effective, but it can fail. In rare cases, the fallopian tubes can grow back together over time. If this  happens, pregnancy may be possible and you will be able to get pregnant again. Women who have had this procedure have a higher chance of having an ectopic pregnancy. An ectopic pregnancy is a pregnancy that happens outside of the uterus. This kind of pregnancy can lead to serious bleeding if it is not treated. What are the benefits?  It is usually effective for a lifetime.  It is usually safe.  It does not have the drawbacks of other types of birth control in that your hormones are not affected. Because of this, your menstrual periods, sexual desire, and sexual performance will not be affected. What are the drawbacks?  You will need to recover and may have complications after surgery.  If you change your mind and decide that you want to have children, you may not be able to. Sterilization may be reversed, but a reversal is not always successful.  It does not provide protection against STDs (sexually transmitted diseases).  It increases the chance of having an ectopic pregnancy. Follow these instructions at home:  Keep all follow-up visits as told by your health care provider. This is important. Summary  Female sterilization is surgery to prevent pregnancy.  There are different types of female sterilization surgeries.  Sterilization may be reversed, but a reversal is not always successful.  Sterilization does not protect against STDs. This information is not intended to replace advice given to you by your health care provider. Make sure you discuss any questions you have with your health care provider. Document Revised: 08/04/2018 Document Reviewed: 10/30/2017 Elsevier Patient Education  2020 Elsevier Inc.  

## 2020-01-09 ENCOUNTER — Ambulatory Visit (INDEPENDENT_AMBULATORY_CARE_PROVIDER_SITE_OTHER): Payer: Medicaid Other

## 2020-01-09 ENCOUNTER — Ambulatory Visit (INDEPENDENT_AMBULATORY_CARE_PROVIDER_SITE_OTHER): Payer: Medicaid Other | Admitting: Obstetrics

## 2020-01-09 ENCOUNTER — Other Ambulatory Visit: Payer: Self-pay

## 2020-01-09 VITALS — BP 110/60 | Wt 240.0 lb

## 2020-01-09 DIAGNOSIS — O0993 Supervision of high risk pregnancy, unspecified, third trimester: Secondary | ICD-10-CM

## 2020-01-09 DIAGNOSIS — O99213 Obesity complicating pregnancy, third trimester: Secondary | ICD-10-CM

## 2020-01-09 DIAGNOSIS — Z3A37 37 weeks gestation of pregnancy: Secondary | ICD-10-CM

## 2020-01-09 LAB — FETAL NONSTRESS TEST

## 2020-01-09 LAB — POCT URINALYSIS DIPSTICK OB
Glucose, UA: NEGATIVE
POC,PROTEIN,UA: NEGATIVE

## 2020-01-09 NOTE — Progress Notes (Signed)
No concerns.rj 

## 2020-01-09 NOTE — Progress Notes (Signed)
Routine Prenatal Care Visit  Subjective  Abigail Mejia is a 29 y.o. [id number] at [redacted]w[redacted]d being seen today for ongoing prenatal care.  She is currently monitored for the following issues for this high-risk pregnancy and has Supervision of high risk pregnancy, antepartum; Obesity affecting pregnancy, antepartum; and BMI 40.0-44.9, adult (HCC) on their problem list.  ----------------------------------------------------------------------------------- Patient reports no complaints.    .  .   Abigail Mejia denies.  ----------------------------------------------------------------------------------- The following portions of the patient's history were reviewed and updated as appropriate: allergies, current medications, past family history, past medical history, past social history, past surgical history and problem list. Problem list updated.  Objective  Blood pressure 110/60, weight 240 lb (108.9 kg), last menstrual period 04/21/2019. Pregravid weight 255 lb (115.7 kg) Total Weight Gain -15 lb (-6.804 kg) Urinalysis: Urine Protein Negative  Urine Glucose Negative  Fetal Status:           General:  Alert, oriented and cooperative. Patient is in no acute distress.  Skin: Skin is warm and dry. No rash noted.   Cardiovascular: Normal heart rate noted  Respiratory: Normal respiratory effort, no problems with respiration noted  Abdomen: Soft, gravid, appropriate for gestational age.       Pelvic:  Cervical exam deferred        Extremities: Normal range of motion.     Mental Status: Normal mood and affect. Normal behavior. Normal judgment and thought content.   Assessment   29 y.o. [id number] at [redacted]w[redacted]d by  01/26/2020, by Last Menstrual Period presenting for routine prenatal visit  Plan   pregnancy Problems (from 06/07/19 to present)    Problem Noted Resolved   Supervision of high risk pregnancy, antepartum 06/07/2019 by Abigail Mejia, CNM No   Overview Addendum 01/03/2020  9:20 AM by Abigail Novak, MD    Clinic [facility] Prenatal Labs  Dating  LMP = 8wk Blood type: O/Positive/-- (04/16 1452)   Genetic Screen NIPS: Normal xy Antibody:Negative (04/16 1452)  Anatomic [place] complete Rubella: 1.86 (04/16 1452)  Varicella: immune  GTT Early:       77         Third trimester: 94 RPR: Non Reactive (09/03 0939)   Rhogam Not needed HBsAg: Negative (04/16 1452)   Vaccines TDAP: 11/18/19                     Flu Shot: HIV: Non Reactive (09/03 0939)   Baby Food Breast                               AOZ:[id number]/-- (10/29 1645)  Contraception Desires BTL: consent [signed 9/3] Pap: 2019 negative  CBB     CS/VBAC NA BMI 43 at NOB: early 1 hr gtt, baby ASA, 3rd trimester APT  Support Person Abigail Mejia          Previous Version   Obesity affecting pregnancy, antepartum 06/07/2019 by Abigail Mejia, CNM No   Overview Addendum 01/03/2020  9:21 AM by Abigail Novak, MD    -- patient has lost ~20 pounds during pregnancy - BMI @ 36wks=37 BMI >=40 [x]  early 1h gtt -  [x]  u/s for dating [x]   [x]  bASA (>12 weeks) [x]  Growth u/s 28 [ ] , 32 [ ] , 36 weeks [x]  [ ]  NST/AFI weekly 34+ weeks (34[] ,35[] ,36[x] , 37[] , 38[] , 39[] , 40[] ) [ ]  IOL by 41 weeks (scheduled, prn [] )      Previous  Version       Term labor symptoms and general obstetric precautions including but not limited to vaginal bleeding, contractions, leaking of Mejia and fetal movement were reviewed in detail with the patient. Please refer to After Visit Summary for other counseling recommendations.  Reviewed her ultrasound today.  AFI is 7.7cms GBS is negative. She is not interested in IOL, unless she reaches 40 weeks.  Return in about 1 week (around 01/16/2020) for return OB, NST.  Abigail Mejia, CNM  01/09/2020 9:16 AM

## 2020-01-12 ENCOUNTER — Ambulatory Visit (INDEPENDENT_AMBULATORY_CARE_PROVIDER_SITE_OTHER): Payer: Medicaid Other | Admitting: Obstetrics and Gynecology

## 2020-01-12 ENCOUNTER — Other Ambulatory Visit: Payer: Self-pay

## 2020-01-12 ENCOUNTER — Encounter: Payer: Self-pay | Admitting: Obstetrics and Gynecology

## 2020-01-12 VITALS — BP 118/74 | Wt 243.0 lb

## 2020-01-12 DIAGNOSIS — O0993 Supervision of high risk pregnancy, unspecified, third trimester: Secondary | ICD-10-CM | POA: Diagnosis not present

## 2020-01-12 DIAGNOSIS — O99213 Obesity complicating pregnancy, third trimester: Secondary | ICD-10-CM

## 2020-01-12 DIAGNOSIS — Z3A38 38 weeks gestation of pregnancy: Secondary | ICD-10-CM | POA: Diagnosis not present

## 2020-01-12 NOTE — Progress Notes (Signed)
Routine Prenatal Care Visit  Subjective  Abigail Mejia is a 29 y.o. G3P2002 at [redacted]w[redacted]d being seen today for ongoing prenatal care.  She is currently monitored for the following issues for this high-risk pregnancy and has Supervision of high risk pregnancy, antepartum; Obesity affecting pregnancy, antepartum; and BMI 40.0-44.9, adult (HCC) on their problem list.  ----------------------------------------------------------------------------------- Patient reports no complaints.   Contractions: Not present. Vag. Bleeding: None.  Movement: Present. Leaking Fluid denies.  ----------------------------------------------------------------------------------- The following portions of the patient's history were reviewed and updated as appropriate: allergies, current medications, past family history, past medical history, past social history, past surgical history and problem list. Problem list updated.  Objective  Blood pressure 118/74, weight 243 lb (110.2 kg), last menstrual period 04/21/2019. Pregravid weight 255 lb (115.7 kg) Total Weight Gain -12 lb (-5.443 kg) Urinalysis: Urine Protein    Urine Glucose    Fetal Status: Fetal Heart Rate (bpm): 140   Movement: Present  Presentation: Vertex  General:  Alert, oriented and cooperative. Patient is in no acute distress.  Skin: Skin is warm and dry. No rash noted.   Cardiovascular: Normal heart rate noted  Respiratory: Normal respiratory effort, no problems with respiration noted  Abdomen: Soft, gravid, appropriate for gestational age. Pain/Pressure: Absent     Pelvic:  Cervical exam deferred        Extremities: Normal range of motion.  Edema: None  Mental Status: Normal mood and affect. Normal behavior. Normal judgment and thought content.   Bedside u/s shows MVP: 4.9 cm  NST: Baseline FHR: 140 beats/min Variability: moderate Accelerations: present Decelerations: absent Tocometry: not done  Interpretation:  INDICATIONS: obesity, BMI >35  (initially > 40) RESULTS:  A NST procedure was performed with FHR monitoring and a normal baseline established, appropriate time of 20-40 minutes of evaluation, and accels >2 seen w 15x15 characteristics.  Results show a REACTIVE NST.    Assessment   29 y.o. [id number] at [redacted]w[redacted]d by  01/26/2020, by Last Menstrual Period presenting for routine prenatal visit  Plan   pregnancy Problems (from 06/07/19 to present)    Problem Noted Resolved   Supervision of high risk pregnancy, antepartum 06/07/2019 by [name], CNM No   Overview Addendum 01/03/2020  9:20 AM by Conard Novak, MD    [facility]inic [facility] Prenatal Labs  Dating  LMP = 8wk Blood type: O/Positive/-- (04/16 1452)   Genetic Screen NIPS: Normal xy Antibody:Negative (04/16 1452)  Anatomic [place] complete Rubella: 1.86 (04/16 1452)  Varicella: immune  GTT Early:       77         Third trimester: 94 RPR: Non Reactive (09/03 0939)   Rhogam Not needed HBsAg: Negative (04/16 1452)   Vaccines TDAP: 11/18/19                     Flu Shot: HIV: Non Reactive (09/03 0939)   Baby Food Breast                               [id number]/-- (10/29 1645)  Contraception Desires BTL: consent [signed 9/3] Pap: 2019 negative  CBB     CS/VBAC NA BMI 43 at NOB: early 1 hr gtt, baby ASA, 3rd trimester APT  Support Person Tim          Previous Version   Obesity affecting pregnancy, antepartum 06/07/2019 by [name], CNM No   Overview Addendum 01/03/2020  9:21 AM  by Conard Novak, MD    -- patient has lost ~20 pounds during pregnancy - BMI @ 36wks=37 BMI >=40 [x]  early 1h gtt -  [x]  u/s for dating [x]   [x]  bASA (>12 weeks) [x]  Growth u/s 28 [ ] , 32 [ ] , 36 weeks [x]  [ ]  NST/AFI weekly 34+ weeks (34[] ,35[] ,36[x] , 37[] , 38[] , 39[] , 40[] ) [ ]  IOL by 41 weeks (scheduled, prn [] )      Previous Version       Term labor symptoms and general obstetric precautions including but not limited to vaginal bleeding, contractions, leaking of fluid and  fetal movement were reviewed in detail with the patient. Please refer to After Visit Summary for other counseling recommendations.   Return in about 4 days (around 01/16/2020) for previuosly scheduled appts.   , MD, OB/GYN, [facility] 01/12/2020 2:55 PM

## 2020-01-16 ENCOUNTER — Other Ambulatory Visit: Payer: Self-pay

## 2020-01-16 ENCOUNTER — Encounter: Payer: Self-pay | Admitting: Obstetrics and Gynecology

## 2020-01-16 ENCOUNTER — Other Ambulatory Visit: Payer: Medicaid Other

## 2020-01-16 ENCOUNTER — Ambulatory Visit (INDEPENDENT_AMBULATORY_CARE_PROVIDER_SITE_OTHER): Payer: Medicaid Other | Admitting: Obstetrics and Gynecology

## 2020-01-16 DIAGNOSIS — Z3A38 38 weeks gestation of pregnancy: Secondary | ICD-10-CM

## 2020-01-16 DIAGNOSIS — O0993 Supervision of high risk pregnancy, unspecified, third trimester: Secondary | ICD-10-CM

## 2020-01-16 DIAGNOSIS — O99213 Obesity complicating pregnancy, third trimester: Secondary | ICD-10-CM

## 2020-01-16 NOTE — Progress Notes (Signed)
Routine Prenatal Care Visit- Virtual Visit  Subjective   Virtual Visit via Telephone Note  I connected with Abigail D Hendershott on 01/16/20 at  9:10 AM EST by telephone and verified that I am speaking with the correct person using two identifiers.   I discussed the limitations, risks, security and privacy concerns of performing an evaluation and management service by telephone and the availability of in person appointments. I also discussed with the patient that there may be a patient responsible charge related to this service. The patient expressed understanding and agreed to proceed.  The patient was at home I spoke with the patient from my office The names of people involved in this encounter were: Abigail Mejia and Abigail Mohair, MD.   Abigail Mejia is a 29 y.o. [phone] at [redacted]w[redacted]d being seen today for ongoing prenatal care.  She is currently monitored for the following issues for this high-risk pregnancy and has Supervision of high risk pregnancy, antepartum; Obesity affecting pregnancy, antepartum; and BMI 40.0-44.9, adult (HCC) on their problem list.  ----------------------------------------------------------------------------------- Patient reports she was diagnosed with COVID19 yesterday.  She had a slight headache yesterday and she had exposure at work. She feels tired today.     .  .   . Denies leaking of fluid.  ----------------------------------------------------------------------------------- The following portions of the patient's history were reviewed and updated as appropriate: allergies, current medications, past family history, past medical history, past social history, past surgical history and problem list. Problem list updated.   Objective  Last menstrual period 04/21/2019. Pregravid weight 255 lb (115.7 kg) Total Weight Gain -12 lb (-5.443 kg) Urinalysis:      Fetal Status:           Physical Exam could not be performed. Because of the COVID-19 outbreak this visit  was performed over the phone and not in person.   Assessment   29 y.o. G4W1027 at [redacted]w[redacted]d by  01/26/2020, by Last Menstrual Period presenting for routine prenatal visit  Plan   pregnancy Problems (from 06/07/19 to present)    Problem Noted Resolved   Supervision of high risk pregnancy, antepartum 06/07/2019 by [name], CNM No   Overview Addendum 01/03/2020  9:20 AM by Conard Novak, MD    [facility]inic [facility] Prenatal Labs  Dating  LMP = 8wk Blood type: O/Positive/-- (04/16 1452)   Genetic Screen NIPS: Normal xy Antibody:Negative (04/16 1452)  Anatomic [place] complete Rubella: 1.86 (04/16 1452)  Varicella: immune  GTT Early:       77         Third trimester: 94 RPR: Non Reactive (09/03 0939)   Rhogam Not needed HBsAg: Negative (04/16 1452)   Vaccines TDAP: 11/18/19                     Flu Shot: HIV: Non Reactive (09/03 0939)   Baby Food Breast                               [id number]:[id number]/-- (10/29 1645)  Contraception Desires BTL: consent [signed 9/3] Pap: 2019 negative  CBB     CS/VBAC NA BMI 43 at NOB: early 1 hr gtt, baby ASA, 3rd trimester APT  Support Person Tim          Previous Version   Obesity affecting pregnancy, antepartum 06/07/2019 by [name], CNM No   Overview Addendum 01/03/2020  9:21 AM by Conard Novak,  MD    -- patient has lost ~20 pounds during pregnancy - BMI @ 36wks=37 BMI >=40 [x]  early 1h gtt -  [x]  u/s for dating [x]   [x]  bASA (>12 weeks) [x]  Growth u/s 28 [ ] , 32 [ ] , 36 weeks [x]  [ ]  NST/AFI weekly 34+ weeks (34[] ,35[] ,36[x] , 37[] , 38[] , 39[] , 40[] ) [ ]  IOL by 41 weeks (scheduled, prn [] )      Previous Version     Gestational age appropriate obstetric precautions including but not limited to vaginal bleeding, contractions, leaking of fluid and fetal movement were reviewed in detail with the patient.    Follow Up Instructions: Discussed mAbs and she is interested in getting them.   Precautions for covid and worsening symptoms given. She  completed her vaccination serious in August.    I discussed the assessment and treatment plan with the patient. The patient was provided an opportunity to ask questions and all were answered. The patient agreed with the plan and demonstrated an understanding of the instructions.   The patient was advised to call back or seek an in-person evaluation if the symptoms worsen or if the condition fails to improve as anticipated.  I provided 11 minutes of non-face-to-face time during this encounter.  Return in about 1 week (around 01/23/2020) for Routine Prenatal Appointment.  , MD  [facility], [facility] 01/16/2020 10:23 AM

## 2020-01-19 ENCOUNTER — Encounter: Payer: Self-pay | Admitting: Obstetrics and Gynecology

## 2020-01-19 ENCOUNTER — Encounter: Payer: Medicaid Other | Admitting: Obstetrics & Gynecology

## 2020-01-19 ENCOUNTER — Other Ambulatory Visit: Payer: Self-pay

## 2020-01-19 ENCOUNTER — Ambulatory Visit (INDEPENDENT_AMBULATORY_CARE_PROVIDER_SITE_OTHER): Payer: Medicaid Other | Admitting: Obstetrics and Gynecology

## 2020-01-19 VITALS — Wt 243.0 lb

## 2020-01-19 DIAGNOSIS — O0993 Supervision of high risk pregnancy, unspecified, third trimester: Secondary | ICD-10-CM

## 2020-01-19 DIAGNOSIS — O99213 Obesity complicating pregnancy, third trimester: Secondary | ICD-10-CM

## 2020-01-19 DIAGNOSIS — Z3A39 39 weeks gestation of pregnancy: Secondary | ICD-10-CM

## 2020-01-19 NOTE — Progress Notes (Signed)
Routine Prenatal Care Visit- Virtual Visit  Subjective   Virtual Visit via Telephone Note  I connected with Abigail Mejia on 01/19/20 at  3:10 PM EST by telephone and verified that I am speaking with the correct person using two identifiers.   I discussed the limitations, risks, security and privacy concerns of performing an evaluation and management service by telephone and the availability of in person appointments. I also discussed with the patient that there may be a patient responsible charge related to this service. The patient expressed understanding and agreed to proceed.  The patient was at home I spoke with the patient from my  office The names of people involved in this encounter were: Abigail and Dr. Jerene Pitch.    Abigail Mejia is a 29 y.o. G3P2002 at [redacted]w[redacted]d being seen today for ongoing prenatal care.  She is currently monitored for the following issues for this high-risk pregnancy and has Supervision of high risk pregnancy, antepartum; Obesity affecting pregnancy, antepartum; and BMI 40.0-44.9, adult (HCC) on their problem list.  ----------------------------------------------------------------------------------- Patient reports no complaints.  She is feeling better after mAb infusion. She tested positive for COVID on 01/15/2020. She denies fevers or cough.   Contractions: Not present. Vag. Bleeding: None.  Movement: Present. Denies leaking of fluid.  ----------------------------------------------------------------------------------- The following portions of the patient's history were reviewed and updated as appropriate: allergies, current medications, past family history, past medical history, past social history, past surgical history and problem list. Problem list updated.   Objective  Weight 243 lb (110.2 kg), last menstrual period 04/21/2019. Pregravid weight 255 lb (115.7 kg) Total Weight Gain -12 lb (-5.443 kg) Urinalysis:      Fetal Status:     Movement: Present      Physical Exam could not be performed. Because of the COVID-19 outbreak this visit was performed over the phone and not in person.   Assessment   29 y.o. A1O8786 at [redacted]w[redacted]d by  01/26/2020, by Last Menstrual Period presenting for routine prenatal visit  Plan   pregnancy Problems (from 06/07/19 to present)    Problem Noted Resolved   Supervision of high risk pregnancy, antepartum 06/07/2019 by [name], CNM No   Overview Addendum 01/03/2020  9:20 AM by Conard Novak, MD    [facility] Prenatal Labs  Dating  LMP = 8wk Blood type: O/Positive/-- (04/16 1452)   Genetic Screen NIPS: Normal xy Antibody:Negative (04/16 1452)  Anatomic [place] complete Rubella: 1.86 (04/16 1452)  Varicella: immune  GTT Early:       77         Third trimester: 94 RPR: Non Reactive (09/03 0939)   Rhogam Not needed HBsAg: Negative (04/16 1452)   Vaccines TDAP: 11/18/19                     Flu Shot: HIV: Non Reactive (09/03 0939)   Baby Food Breast                               [id number]/-- (10/29 1645)  Contraception Desires BTL: consent [signed 9/3] Pap: 2019 negative  CBB     CS/VBAC NA BMI 43 at NOB: early 1 hr gtt, baby ASA, 3rd trimester APT  Support Person Tim          Previous Version   Obesity affecting pregnancy, antepartum 06/07/2019 by [name], CNM No   Overview Addendum 01/03/2020  9:21 AM  by Conard Novak, MD    -- patient has lost ~20 pounds during pregnancy - BMI @ 36wks=37 BMI >=40 [x]  early 1h gtt -  [x]  u/s for dating [x]   [x]  bASA (>12 weeks) [x]  Growth u/s 28 [ ] , 32 [ ] , 36 weeks [x]  [ ]  NST/AFI weekly 34+ weeks (34[] ,35[] ,36[x] , 37[] , 38[] , 39[] , 40[] ) [ ]  IOL by 41 weeks (scheduled, prn [] )      Previous Version       Gestational age appropriate obstetric precautions including but not limited to vaginal bleeding, contractions, leaking of fluid and fetal movement were reviewed in detail with the patient.     Follow Up Instructions: Will schedule phone  visit for 1 week from.    I discussed the assessment and treatment plan with the patient. The patient was provided an opportunity to ask questions and all were answered. The patient agreed with the plan and demonstrated an understanding of the instructions.   The patient was advised to call back or seek an in-person evaluation if the symptoms worsen or if the condition fails to improve as anticipated.  I provided 5 minutes of non-face-to-face time during this encounter.  Return in about 1 week (around 01/26/2020) for telephone ROB visit.  MD [facility], [facility] 01/19/2020 4:04 PM

## 2020-01-19 NOTE — Progress Notes (Signed)
TELEPHONE VISIT

## 2020-01-25 ENCOUNTER — Other Ambulatory Visit: Payer: Self-pay

## 2020-01-25 ENCOUNTER — Ambulatory Visit (INDEPENDENT_AMBULATORY_CARE_PROVIDER_SITE_OTHER): Payer: Medicaid Other | Admitting: Obstetrics

## 2020-01-25 DIAGNOSIS — Z3A39 39 weeks gestation of pregnancy: Secondary | ICD-10-CM

## 2020-01-25 DIAGNOSIS — O99213 Obesity complicating pregnancy, third trimester: Secondary | ICD-10-CM

## 2020-01-25 DIAGNOSIS — O0993 Supervision of high risk pregnancy, unspecified, third trimester: Secondary | ICD-10-CM

## 2020-01-25 NOTE — Progress Notes (Signed)
Virtual Visit via Telephone Note  I connected with Abigail Mejia Depace on 01/25/20 at  1:50 PM EST by telephone and verified that I am speaking with the correct person using two identifiers.  Location: Patient: Abigail Mejia. Counts  Provider: [name], CNM  01/25/2020 4:40 PM     I discussed the limitations, risks, security and privacy concerns of performing an evaluation and management service by telephone and the availability of in person appointments. I also discussed with the patient that there may be a patient responsible charge related to this service. The patient expressed understanding and agreed to proceed.   History of Present Illness:   Routine Prenatal Care Visit  Subjective  Abigail Mejia is a 29 y.o. V2Z3664 at [redacted]w[redacted]d being evaluated by phone only for ongoing prenatal care.  She is currently monitored for the following issues for this high-risk pregnancy and has Supervision of high risk pregnancy, antepartum; Obesity affecting pregnancy, antepartum; and BMI 40.0-44.9, adult (HCC) on their problem list.  ----------------------------------------------------------------------------------- Patient reports no bleeding, no contractions and no cramping.   Contractions: Irregular. Vag. Bleeding: None.  Movement: Present. Leaking Fluid denies.  ----------------------------------------------------------------------------------- The following portions of the patient's history were reviewed and updated as appropriate: allergies, current medications, past family history, past medical history, past social history, past surgical history and problem list. Problem list updated.  Objective  Last menstrual period 04/21/2019. Pregravid weight 255 lb (115.7 kg) Total Weight Gain -12 lb (-5.443 kg)    Fetal Status:     Movement: Present     General:  Alert, oriented and cooperative. Patient is in no acute distress.  Skin:   Cardiovascular:   Respiratory:   Abdomen:   Pelvic:  Cervical exam  deferred        Extremities: Normal range of motion.     Mental Status: Normal mood and affect. Normal behavior. Normal judgment and thought content.   Assessment   29 y.o. [name] at [redacted]w[redacted]d by  01/26/2020, by Last Menstrual Period for telephone OB visit only for routine prenatal visit  Plan   pregnancy Problems (from 06/07/19 to present)    Problem Noted Resolved   Supervision of high risk pregnancy, antepartum 06/07/2019 by [name], CNM No   Overview Addendum 01/03/2020  9:20 AM by Conard Novak, MD    [facility] Prenatal Labs  Dating  LMP = 8wk Blood type: O/Positive/-- (04/16 1452)   Genetic Screen NIPS: Normal xy Antibody:Negative (04/16 1452)  Anatomic [place] complete Rubella: 1.86 (04/16 1452)  Varicella: immune  GTT Early:       77         Third trimester: 94 RPR: Non Reactive (09/03 0939)   Rhogam Not needed HBsAg: Negative (04/16 1452)   Vaccines TDAP: 11/18/19                     Flu Shot: HIV: Non Reactive (09/03 0939)   Baby Food Breast                               VZD:GLOVFIEP/-- (10/29 1645)  Contraception Desires BTL: consent [signed 9/3] Pap: 2019 negative  CBB     CS/VBAC NA BMI 43 at NOB: early 1 hr gtt, baby ASA, 3rd trimester APT  Support Person Tim          Previous Version   Obesity affecting pregnancy, antepartum 06/07/2019 by [name], CNM No  Overview Addendum 01/03/2020  9:21 AM by Conard Novak, MD    -- patient has lost ~20 pounds during pregnancy - BMI @ 36wks=37 BMI >=40 [x]  early 1h gtt -  [x]  u/s for dating [x]   [x]  bASA (>12 weeks) [x]  Growth u/s 28 [ ] , 32 [ ] , 36 weeks [x]  [ ]  NST/AFI weekly 34+ weeks (34[] ,35[] ,36[x] , 37[] , 38[] , 39[] , 40[] ) [ ]  IOL by 41 weeks (scheduled, prn [] )      Previous Version       Term labor symptoms and general obstetric precautions including but not limited to vaginal bleeding, contractions, leaking of fluid and fetal movement were reviewed in detail with the patient. Please refer  to After Visit Summary for other counseling recommendations.   No follow-ups on file.  , CNM  01/25/2020 4:40 PM       Follow Up Instructions:    I discussed the assessment and treatment plan with the patient. The patient was provided an opportunity to ask questions and all were answered. The patient agreed with the plan and demonstrated an understanding of the instructions.   The patient was advised to call back or seek an in-person evaluation if the symptoms worsen or if the condition fails to improve as anticipated.  I provided 15 minutes of non-face-to-face time during this encounter.   , CNM

## 2020-01-25 NOTE — Progress Notes (Signed)
ROB TELEPHONE VISIT

## 2020-01-26 ENCOUNTER — Inpatient Hospital Stay: Payer: Medicaid Other | Admitting: Anesthesiology

## 2020-01-26 ENCOUNTER — Inpatient Hospital Stay
Admission: EM | Admit: 2020-01-26 | Discharge: 2020-01-28 | DRG: 798 | Disposition: A | Discharge status: Home / Self Care | Payer: Medicaid Other | Attending: Obstetrics and Gynecology | Admitting: Obstetrics and Gynecology

## 2020-01-26 ENCOUNTER — Other Ambulatory Visit: Payer: Self-pay

## 2020-01-26 ENCOUNTER — Encounter: Payer: Self-pay | Admitting: Obstetrics and Gynecology

## 2020-01-26 DIAGNOSIS — Z302 Encounter for sterilization: Secondary | ICD-10-CM

## 2020-01-26 DIAGNOSIS — O99214 Obesity complicating childbirth: Secondary | ICD-10-CM | POA: Diagnosis present

## 2020-01-26 DIAGNOSIS — Z3A40 40 weeks gestation of pregnancy: Secondary | ICD-10-CM

## 2020-01-26 DIAGNOSIS — O0990 Supervision of high risk pregnancy, unspecified, unspecified trimester: Secondary | ICD-10-CM

## 2020-01-26 DIAGNOSIS — Z8616 Personal history of COVID-19: Secondary | ICD-10-CM | POA: Diagnosis not present

## 2020-01-26 DIAGNOSIS — O26893 Other specified pregnancy related conditions, third trimester: Secondary | ICD-10-CM | POA: Diagnosis present

## 2020-01-26 DIAGNOSIS — O99210 Obesity complicating pregnancy, unspecified trimester: Secondary | ICD-10-CM

## 2020-01-26 LAB — CBC
HCT: 31.8 % — ABNORMAL LOW (ref 36.0–46.0)
Hemoglobin: 11 g/dL — ABNORMAL LOW (ref 12.0–15.0)
MCH: 31.7 pg (ref 26.0–34.0)
MCHC: 34.6 g/dL (ref 30.0–36.0)
MCV: 91.6 fL (ref 80.0–100.0)
Platelets: 162 10*3/uL (ref 150–400)
RBC: 3.47 MIL/uL — ABNORMAL LOW (ref 3.87–5.11)
RDW: 13.2 % (ref 11.5–15.5)
WBC: 7.2 10*3/uL (ref 4.0–10.5)
nRBC: 0 % (ref 0.0–0.2)

## 2020-01-26 LAB — TYPE AND SCREEN
ABO/RH(D): O POS
Antibody Screen: NEGATIVE

## 2020-01-26 MED ORDER — IBUPROFEN 600 MG PO TABS: 600.0000 mg | ORAL_TABLET | Freq: Four times a day (QID) | ORAL | Status: DC

## 2020-01-26 MED ORDER — AMMONIA AROMATIC IN INHA
RESPIRATORY_TRACT | Status: AC
  Filled 2020-01-26: qty 10

## 2020-01-26 MED ORDER — MISOPROSTOL 200 MCG PO TABS
ORAL_TABLET | ORAL | Status: AC
  Filled 2020-01-26: qty 4

## 2020-01-26 MED ORDER — LACTATED RINGERS IV SOLN: INTRAVENOUS | Status: DC

## 2020-01-26 MED ORDER — FENTANYL 2.5 MCG/ML W/ROPIVACAINE 0.15% IN NS 100 ML EPIDURAL (ARMC)
EPIDURAL | Status: AC
  Filled 2020-01-26: qty 100

## 2020-01-26 MED ORDER — ACETAMINOPHEN 325 MG PO TABS: 650.0000 mg | ORAL_TABLET | ORAL | Status: DC | PRN

## 2020-01-26 MED ORDER — LIDOCAINE HCL (PF) 1 % IJ SOLN
30.0000 mL | INTRAMUSCULAR | Status: DC | PRN
  Filled 2020-01-26: qty 30

## 2020-01-26 MED ORDER — OXYTOCIN BOLUS FROM INFUSION
333.0000 mL | Freq: Once | INTRAVENOUS | Status: AC
  Administered 2020-01-26: 333 mL via INTRAVENOUS

## 2020-01-26 MED ORDER — BUPIVACAINE HCL (PF) 0.25 % IJ SOLN
INTRAMUSCULAR | Status: DC | PRN
  Administered 2020-01-26 (×2): 4 mL via EPIDURAL

## 2020-01-26 MED ORDER — FENTANYL 2.5 MCG/ML W/ROPIVACAINE 0.15% IN NS 100 ML EPIDURAL (ARMC)
EPIDURAL | Status: DC | PRN
  Administered 2020-01-26: 12 mL/h via EPIDURAL

## 2020-01-26 MED ORDER — OXYTOCIN-SODIUM CHLORIDE 30-0.9 UT/500ML-% IV SOLN
2.5000 [IU]/h | INTRAVENOUS | Status: DC
  Administered 2020-01-26: 2.5 [IU]/h via INTRAVENOUS
  Filled 2020-01-26: qty 500

## 2020-01-26 MED ORDER — LIDOCAINE-EPINEPHRINE (PF) 1.5 %-1:200000 IJ SOLN
INTRAMUSCULAR | Status: DC | PRN
  Administered 2020-01-26: 4 mL via EPIDURAL

## 2020-01-26 MED ORDER — OXYTOCIN 10 UNIT/ML IJ SOLN
INTRAMUSCULAR | Status: AC
  Filled 2020-01-26: qty 2

## 2020-01-26 MED ORDER — ONDANSETRON HCL 4 MG/2ML IJ SOLN: 4.0000 mg | Freq: Four times a day (QID) | INTRAMUSCULAR | Status: DC | PRN

## 2020-01-26 MED ORDER — BENZOCAINE-MENTHOL 20-0.5 % EX AERO: 1.0000 "application " | INHALATION_SPRAY | CUTANEOUS | Status: DC | PRN

## 2020-01-26 MED ORDER — ACETAMINOPHEN 325 MG PO TABS
650.0000 mg | ORAL_TABLET | ORAL | Status: DC | PRN
  Administered 2020-01-27 – 2020-01-28 (×3): 650 mg via ORAL
  Filled 2020-01-26 (×3): qty 2

## 2020-01-26 MED ORDER — OXYCODONE-ACETAMINOPHEN 5-325 MG PO TABS: 2.0000 | ORAL_TABLET | ORAL | Status: DC | PRN

## 2020-01-26 MED ORDER — LACTATED RINGERS IV SOLN
500.0000 mL | INTRAVENOUS | Status: DC | PRN
  Administered 2020-01-26: 500 mL via INTRAVENOUS

## 2020-01-26 MED ORDER — LIDOCAINE HCL (PF) 1 % IJ SOLN
INTRAMUSCULAR | Status: DC | PRN
  Administered 2020-01-26: 3 mL via SUBCUTANEOUS

## 2020-01-26 MED ORDER — OXYCODONE-ACETAMINOPHEN 5-325 MG PO TABS: 1.0000 | ORAL_TABLET | ORAL | Status: DC | PRN

## 2020-01-26 MED ORDER — SOD CITRATE-CITRIC ACID 500-334 MG/5ML PO SOLN: 30.0000 mL | ORAL | Status: DC | PRN

## 2020-01-26 NOTE — Anesthesia Procedure Notes (Signed)
Epidural Patient location during procedure: OB Start time: 01/26/2020 8:17 PM End time: 01/26/2020 8:26 PM  Staffing Anesthesiologist: Naomie Dean, MD Resident/CRNA: Irving Burton, CRNA Performed: resident/CRNA   Preanesthetic Checklist Completed: patient identified, IV checked, site marked, risks and benefits discussed, surgical consent, monitors and equipment checked, pre-op evaluation and timeout performed  Epidural Patient position: sitting Prep: ChloraPrep Patient monitoring: heart rate, continuous pulse ox and blood pressure Approach: midline Location: L3-L4 Injection technique: LOR saline  Needle:  Needle type: Tuohy  Needle gauge: 17 G Needle length: 9 cm and 9 Needle insertion depth: 9 cm Catheter type: closed end flexible Catheter size: 19 Gauge Catheter at skin depth: 13 cm Test dose: negative and 1.5% lidocaine with Epi 1:200 K  Assessment Sensory level: T10 Events: blood not aspirated, injection not painful, no injection resistance, no paresthesia and negative IV test  Additional Notes 1 attempt Pt. Evaluated and documentation done after procedure finished. Patient identified. Risks/Benefits/Options discussed with patient including but not limited to bleeding, infection, nerve damage, paralysis, failed block, incomplete pain control, headache, blood pressure changes, nausea, vomiting, reactions to medication both or allergic, itching and postpartum back pain. Confirmed with bedside nurse the patient's most recent platelet count. Confirmed with patient that they are not currently taking any anticoagulation, have any bleeding history or any family history of bleeding disorders. Patient expressed understanding and wished to proceed. All questions were answered. Sterile technique was used throughout the entire procedure. Please see nursing notes for vital signs. Test dose was given through epidural catheter and negative prior to continuing to dose epidural or  start infusion. Warning signs of high block given to the patient including shortness of breath, tingling/numbness in hands, complete motor block, or any concerning symptoms with instructions to call for help. Patient was given instructions on fall risk and not to get out of bed. All questions and concerns addressed with instructions to call with any issues or inadequate analgesia.   Patient tolerated the insertion well without immediate complications.Reason for block:procedure for pain

## 2020-01-26 NOTE — H&P (Signed)
Abigail Mejia is a 29 y.o. female , a G3P2 at [redacted] weeks gestation, who is admitted in active labor. She reports having irregular contractions over the last several days, and this evening they became stronger. She denies any LOF or vaginal bleeding. Her baby has been moving well. She denies any urge to bear down. Her last OB visit took place by phone, as she had a + COVIDtest over 10 days ago, and was staying at home on isolation..She desies an epidural for pain. OB History    Gravida  3   Para  2   Term  2   Preterm      AB      Living  2     SAB      TAB      Ectopic      Multiple  0   Live Births  2          Past Medical History:  Diagnosis Date  . Chronic low back pain without sciatica 12/21/2015  . Medical history non-contributory    Past Surgical History:  Procedure Laterality Date  . ETHMOIDECTOMY Bilateral 05/13/2018   Procedure: ETHMOIDECTOMY;  Surgeon: Vernie Murders, MD;  Location: [facility];  Service: ENT;  Laterality: Bilateral;  . IMAGE GUIDED SINUS SURGERY Bilateral 05/13/2018   Procedure: IMAGE GUIDED SINUS SURGERY WITH PROPEL IMPLANT;  Surgeon: Vernie Murders, MD;  Location: [facility];  Service: ENT;  Laterality: Bilateral;  stryker disk needed PROPEL IMPLANT PLACEMENT GAVE DISK TO CECE 2-28 KP  . MAXILLARY ANTROSTOMY Bilateral 05/13/2018   Procedure: MAXILLARY ANTROSTOMY removal of tissue;  Surgeon: Vernie Murders, MD;  Location: [facility];  Service: ENT;  Laterality: Bilateral;  . NASAL SINUS SURGERY Bilateral 05/13/2018   Procedure: FRONTAL SINUSOTOMY;  Surgeon: Vernie Murders, MD;  Location: [facility];  Service: ENT;  Laterality: Bilateral;  . nexplanon insertion  2012;10/21/2013;  . SPHENOIDECTOMY Bilateral 05/13/2018   Procedure: SPHENOIDECTOMY with tissue removal;  Surgeon: Vernie Murders, MD;  Location: [facility];  Service: ENT;  Laterality: Bilateral;  . TOOTH EXTRACTION     Family History: family  history is not on file. Social History:  reports that she has never smoked. She has never used smokeless tobacco. She reports that she does not drink alcohol and does not use drugs.     Maternal Diabetes: No Genetic Screening: Normal Maternal Ultrasounds/Referrals: Normal Fetal Ultrasounds or other Referrals:  None Maternal Substance Abuse:  No Significant Maternal Medications:  None Significant Maternal Lab Results:  Group B Strep negative Other Comments:  None  Review of Systems  Constitutional: Negative.   HENT: Negative.   Eyes: Negative.   Respiratory: Negative.   Cardiovascular: Negative.   Gastrointestinal: Negative.        Gravid abdomen.  Endocrine: Negative.   Genitourinary: Positive for pelvic pain.       Contracting q 2-3 minutes  Musculoskeletal: Negative.   Allergic/Immunologic: Negative.   Neurological: Negative.   Psychiatric/Behavioral: Negative.    History Dilation: 6 Effacement (%): 70 Station: -3 Exam by:: Abigail Mejia, CNM Blood pressure 112/64, pulse 94, temperature 98.5 F (36.9 C), temperature source Oral, resp. rate 20, height 5\' 7"  (1.702 m), weight 111.1 kg, last menstrual period 04/21/2019, SpO2 99 %. Maternal Exam:  Uterine Assessment: Contraction strength is moderate.  Contraction frequency is regular.   Abdomen: Patient reports no abdominal tenderness. Estimated fetal weight is 7.5 lbs.   Fetal presentation: vertex  Introitus: Normal vulva. Normal vagina.  Ferning test: not done.  Nitrazine test: not done.  Pelvis: adequate for delivery.   Cervix: Cervix evaluated by digital exam.     Physical Exam Constitutional:      Appearance: Normal appearance.  HENT:     Head: Normocephalic and atraumatic.     Nose: Nose normal.  Eyes:     Extraocular Movements: Extraocular movements intact.     Pupils: Pupils are equal, round, and reactive to light.  Cardiovascular:     Rate and Rhythm: Normal rate and regular rhythm.     Pulses: Normal  pulses.     Heart sounds: Normal heart sounds.  Pulmonary:     Effort: Pulmonary effort is normal.     Breath sounds: Normal breath sounds.  Abdominal:     General: Bowel sounds are normal.  Genitourinary:    General: Normal vulva.  Musculoskeletal:        General: Normal range of motion.     Cervical back: Normal range of motion and neck supple.  Skin:    General: Skin is warm and dry.  Neurological:     General: No focal deficit present.     Mental Status: She is alert and oriented to person, place, and time.  Psychiatric:        Mood and Affect: Mood normal.        Behavior: Behavior normal.     Prenatal labs: ABO, Rh: --/O+--/PENDING (11/25 2005) Antibody: negative Rubella: 1.86 (04/16 1452) RPR: Non Reactive (09/03 0939)  HBsAg: Negative (04/16 1452)  HIV: Non Reactive (09/03 0939)  GBS: Negative/-- (10/29 1645)   Assessment/Plan: IUP 40 weeks Active labor Category 2 tracing Admit to Labor and Delivery. Routine admission labs Continuous EFM IV access Epidural anesthesia Anticipate NSVD BTL on PP Day 1. Dr. Jerene Mejia notified of patient's admission, and desire for PP BTL. Abigail Mejia, CNM  01/26/2020 8:52 PM      Abigail Mejia 01/26/2020, 8:41 PM

## 2020-01-26 NOTE — OB Triage Note (Signed)
Pt is G3P2, [redacted]w[redacted]d presenting with ctx every 3-6 minutes.Pt states ctx began 2 days ago and intensified today. Pt denies any LOF or vaginal bleeding. Pt reported she is feeling fetal movement. Vital signs were BP 124/75, P 94, R 16, T 97.7. Monitors applied and assessing. CNM aware of patient arrival and RN assessment. RN checked pt per CNM verbal order, on assessment pt was 5/70/-2. CNM notified of early/variable decels and intervention done (position change).

## 2020-01-26 NOTE — Anesthesia Preprocedure Evaluation (Addendum)
Anesthesia Evaluation  Patient identified by MRN, date of birth, ID band Patient awake    Reviewed: Allergy & Precautions, H&P , NPO status , Patient's Chart, lab work & pertinent test results  History of Anesthesia Complications Negative for: history of anesthetic complications  Airway Mallampati: I  TM Distance: >3 FB Neck ROM: full    Dental no notable dental hx.    Pulmonary neg pulmonary ROS,    Pulmonary exam normal breath sounds clear to auscultation       Cardiovascular negative cardio ROS Normal cardiovascular exam Rhythm:regular Rate:Normal     Neuro/Psych    GI/Hepatic negative GI ROS, Neg liver ROS,   Endo/Other  negative endocrine ROS  Renal/GU negative Renal ROS     Musculoskeletal   Abdominal   Peds  Hematology negative hematology ROS (+)   Anesthesia Other Findings   Reproductive/Obstetrics (+) Pregnancy                             Anesthesia Physical  Anesthesia Plan  ASA: II  Anesthesia Plan: Epidural   Post-op Pain Management:    Induction:   PONV Risk Score and Plan:   Airway Management Planned:   Additional Equipment:   Intra-op Plan:   Post-operative Plan:   Informed Consent: I have reviewed the patients History and Physical, chart, labs and discussed the procedure including the risks, benefits and alternatives for the proposed anesthesia with the patient or authorized representative who has indicated his/her understanding and acceptance.       Plan Discussed with: Anesthesiologist  Anesthesia Plan Comments: (821  No interval changes ros and planned got with rsi for btl. ja)       Anesthesia Quick Evaluation

## 2020-01-26 NOTE — Discharge Summary (Signed)
Postpartum Discharge Summary  Date of Service updated 01/28/2020     Patient Name: Abigail Mejia DOB: 08/12/1990 MRN: [id number]  Date of admission: 01/26/2020 Delivery date:01/26/2020  Delivering provider: Imagene Riches  Date of discharge: 01/28/2020  Admitting diagnosis: [redacted] weeks gestation of pregnancy [Z3A.40] Intrauterine pregnancy: [redacted]w[redacted]d    Secondary diagnosis:  Active Problems:   [redacted] weeks gestation of pregnancy   Normal labor and delivery   Postpartum care following vaginal delivery   Encounter for female sterilization procedure  Additional problems: none    Discharge diagnosis: Term Pregnancy Delivered                                              Post partum procedures:postpartum tubal ligation Augmentation: N/A Complications: None  Hospital course: Onset of Labor With Vaginal Delivery      29y.o. yo [id number] admitted in Active Labor on 01/26/2020. Patient had an uncomplicated labor course as follows:  Membrane Rupture Time/Date: 10:37 PM ,01/26/2020   Delivery Method:Vaginal, Spontaneous  Episiotomy: None  Lacerations:  None  Patient had an uncomplicated postpartum course.  She is ambulating, tolerating a regular diet, passing flatus, and urinating well. Patient is discharged home in stable condition on 01/28/20.  Newborn Data: Birth date:01/26/2020  Birth time:10:56 PM  Gender:Female  Living status:Living  Apgars:7 ,9  Weight:3060 g   Magnesium Sulfate received: No BMZ received: No Rhophylac:N/A MMR:No T-DaP:Given prenatally Flu: No Transfusion:No  Physical exam  Vitals:   01/27/20 1519 01/27/20 1930 01/27/20 2330 01/28/20 0824  BP: 109/60 (!) 113/55 108/61 108/65  Pulse: 75 66 63   Resp: _0 Temp: 98.3 F (36.8 C) 97.8 F (36.6 C) 98.4 F (36.9 C) 98.3 F (36.8 C)  TempSrc: Oral Oral Oral   SpO2:  97% 98% 99%  Weight:      Height:       General: alert, cooperative and no distress Lochia: appropriate Uterine  Fundus: firm Incision: Healing well with no significant drainage, BTL site is CDI with no discharge or drainage. DVT Evaluation: No evidence of DVT seen on physical exam. Negative Homan's sign. No cords or calf tenderness. Labs: Lab Results  Component Value Date   WBC 10.0 01/27/2020   HGB 11.0 (L) 01/27/2020   HCT 31.3 (L) 01/27/2020   MCV 90.7 01/27/2020   PLT 156 01/27/2020   CMP Latest Ref Rng & Units 05/17/2019  Glucose 70 - 99 mg/dL 86  BUN 6 - 20 mg/dL 8  Creatinine 0.44 - 1.00 mg/dL 0.63  Sodium 135 - 145 mmol/L 138  Potassium 3.5 - 5.1 mmol/L 3.7  Chloride 98 - 111 mmol/L 107  CO2 22 - 32 mmol/L 24  Calcium 8.9 - 10.3 mg/dL 8.9  Total Protein 6.5 - 8.1 g/dL -  Total Bilirubin 0.3 - 1.2 mg/dL -  Alkaline Phos 38 - 126 U/L -  AST 15 - 41 U/L -  ALT 0 - 44 U/L -   Edinburgh Score: Edinburgh Postnatal Depression Scale Screening Tool 01/27/2020  I have been able to laugh and see the funny side of things. 0  I have looked forward with enjoyment to things. 0  I have blamed myself unnecessarily when things went wrong. 0  I have been anxious or worried for no good reason. 0  I have felt  scared or panicky for no good reason. 0  Things have been getting on top of me. 0  I have been so unhappy that I have had difficulty sleeping. 0  I have felt sad or miserable. 0  I have been so unhappy that I have been crying. 0  The thought of harming myself has occurred to me. 0  Edinburgh Postnatal Depression Scale Total 0      After visit meds:  Allergies as of 01/28/2020   Not on File     Medication List    TAKE these medications   cetirizine 10 MG tablet Commonly known as: ZYRTEC Take 10 mg by mouth daily.   cyclobenzaprine 5 MG tablet Commonly known as: FLEXERIL Take 5 mg by mouth 2 (two) times daily as needed.   fluticasone 50 MCG/ACT nasal spray Commonly known as: FLONASE Place 2 sprays into both nostrils daily.   ibuprofen 600 MG tablet Commonly known as:  ADVIL Take 1 tablet (600 mg total) by mouth every 6 (six) hours.   montelukast 10 MG tablet Commonly known as: SINGULAIR Take 10 mg by mouth daily.   oxyCODONE 5 MG immediate release tablet Commonly known as: Oxy IR/ROXICODONE Take 1 tablet (5 mg total) by mouth every 6 (six) hours as needed for up to 5 days for moderate pain.   pantoprazole 40 MG tablet Commonly known as: PROTONIX Take 40 mg by mouth daily.   Prenatal Vitamins 28-0.8 MG Tabs Take 1 tablet by mouth daily.   ProAir HFA 108 (90 Base) MCG/ACT inhaler Generic drug: albuterol Place 2 puffs into the nose every 6 (six) hours as needed for wheezing or shortness of breath.        Discharge home in stable condition Infant Feeding: Breast Infant Disposition:home with mother Discharge instruction: per After Visit Summary and Postpartum booklet. Activity: Advance as tolerated. Pelvic rest for 6 weeks.  Diet: routine diet Anticipated Birth Control: BTL done PP Postpartum Appointment: 6 weeks Additional Postpartum F/U: 6 weeks PP Future Appointments: Future Appointments  Date Time Provider [place]  02/01/2020 11:30 AM Will Bonnet, MD [facility] None   Follow up Visit:  [place], [place], CNM Follow up in 6 week(s).   Specialties: Obstetrics, Gynecology Why: make an appointment for a 6 week postpartum visit and to evaluate your tubal ligation site. You may contaxt the office for an appointment sooner for any concerns related to your tubal ligation site. Contact information: [address] [place] [address] [phone]                   01/28/2020 Imagene Riches, CNM

## 2020-01-27 ENCOUNTER — Encounter
Admission: EM | Disposition: A | Discharge status: Home / Self Care | Payer: Self-pay | Source: Home / Self Care | Attending: Obstetrics and Gynecology

## 2020-01-27 ENCOUNTER — Inpatient Hospital Stay: Payer: Medicaid Other | Admitting: Certified Registered Nurse Anesthetist

## 2020-01-27 ENCOUNTER — Encounter: Payer: Self-pay | Admitting: Obstetrics

## 2020-01-27 DIAGNOSIS — Z302 Encounter for sterilization: Secondary | ICD-10-CM

## 2020-01-27 HISTORY — PX: TUBAL LIGATION: SHX77

## 2020-01-27 LAB — CBC
HCT: 31.3 % — ABNORMAL LOW (ref 36.0–46.0)
Hemoglobin: 11 g/dL — ABNORMAL LOW (ref 12.0–15.0)
MCH: 31.9 pg (ref 26.0–34.0)
MCHC: 35.1 g/dL (ref 30.0–36.0)
MCV: 90.7 fL (ref 80.0–100.0)
Platelets: 156 10*3/uL (ref 150–400)
RBC: 3.45 MIL/uL — ABNORMAL LOW (ref 3.87–5.11)
RDW: 13 % (ref 11.5–15.5)
WBC: 10 10*3/uL (ref 4.0–10.5)
nRBC: 0 % (ref 0.0–0.2)

## 2020-01-27 LAB — RPR: RPR Ser Ql: NONREACTIVE

## 2020-01-27 SURGERY — LIGATION, FALLOPIAN TUBE, POSTPARTUM
Anesthesia: General | Laterality: Bilateral

## 2020-01-27 MED ORDER — IBUPROFEN 600 MG PO TABS
600.0000 mg | ORAL_TABLET | Freq: Four times a day (QID) | ORAL | Status: DC
  Administered 2020-01-27 – 2020-01-28 (×4): 600 mg via ORAL
  Filled 2020-01-27 (×4): qty 1

## 2020-01-27 MED ORDER — ONDANSETRON HCL 4 MG/2ML IJ SOLN: 4.0000 mg | INTRAMUSCULAR | Status: DC | PRN

## 2020-01-27 MED ORDER — OXYCODONE HCL 5 MG PO TABS
5.0000 mg | ORAL_TABLET | ORAL | Status: DC | PRN
  Administered 2020-01-27 – 2020-01-28 (×3): 5 mg via ORAL
  Filled 2020-01-27 (×5): qty 1

## 2020-01-27 MED ORDER — CEFAZOLIN SODIUM-DEXTROSE 2-4 GM/100ML-% IV SOLN
INTRAVENOUS | Status: AC
  Filled 2020-01-27: qty 100

## 2020-01-27 MED ORDER — DIBUCAINE (PERIANAL) 1 % EX OINT: 1.0000 "application " | TOPICAL_OINTMENT | CUTANEOUS | Status: DC | PRN

## 2020-01-27 MED ORDER — LIDOCAINE HCL (PF) 2 % IJ SOLN
INTRAMUSCULAR | Status: AC
  Filled 2020-01-27: qty 5

## 2020-01-27 MED ORDER — WITCH HAZEL-GLYCERIN EX PADS: 1.0000 "application " | MEDICATED_PAD | CUTANEOUS | Status: DC | PRN

## 2020-01-27 MED ORDER — OXYCODONE-ACETAMINOPHEN 5-325 MG PO TABS
1.0000 | ORAL_TABLET | ORAL | Status: DC | PRN
  Filled 2020-01-27: qty 1

## 2020-01-27 MED ORDER — ONDANSETRON HCL 4 MG PO TABS: 4.0000 mg | ORAL_TABLET | ORAL | Status: DC | PRN

## 2020-01-27 MED ORDER — IBUPROFEN 600 MG PO TABS
600.0000 mg | ORAL_TABLET | Freq: Four times a day (QID) | ORAL | Status: DC
  Administered 2020-01-27 (×2): 600 mg via ORAL
  Filled 2020-01-27 (×2): qty 1

## 2020-01-27 MED ORDER — FENTANYL CITRATE (PF) 100 MCG/2ML IJ SOLN
25.0000 ug | INTRAMUSCULAR | Status: DC | PRN
  Administered 2020-01-27 (×3): 25 ug via INTRAVENOUS

## 2020-01-27 MED ORDER — EPHEDRINE SULFATE 50 MG/ML IJ SOLN
INTRAMUSCULAR | Status: DC | PRN
  Administered 2020-01-27: 10 mg via INTRAVENOUS

## 2020-01-27 MED ORDER — PRENATAL MULTIVITAMIN CH
1.0000 | ORAL_TABLET | Freq: Every day | ORAL | Status: DC
  Administered 2020-01-27 – 2020-01-28 (×2): 1 via ORAL
  Filled 2020-01-27 (×2): qty 1

## 2020-01-27 MED ORDER — FENTANYL CITRATE (PF) 100 MCG/2ML IJ SOLN
INTRAMUSCULAR | Status: AC
  Filled 2020-01-27: qty 2

## 2020-01-27 MED ORDER — SENNOSIDES-DOCUSATE SODIUM 8.6-50 MG PO TABS
2.0000 | ORAL_TABLET | ORAL | Status: DC
  Administered 2020-01-27 – 2020-01-28 (×2): 2 via ORAL
  Filled 2020-01-27 (×2): qty 2

## 2020-01-27 MED ORDER — ZOLPIDEM TARTRATE 5 MG PO TABS: 5.0000 mg | ORAL_TABLET | Freq: Every evening | ORAL | Status: DC | PRN

## 2020-01-27 MED ORDER — ONDANSETRON HCL 4 MG/2ML IJ SOLN: 4.0000 mg | Freq: Once | INTRAMUSCULAR | Status: DC | PRN

## 2020-01-27 MED ORDER — TETANUS-DIPHTH-ACELL PERTUSSIS 5-2.5-18.5 LF-MCG/0.5 IM SUSY: 0.5000 mL | PREFILLED_SYRINGE | Freq: Once | INTRAMUSCULAR | Status: DC

## 2020-01-27 MED ORDER — ACETAMINOPHEN 10 MG/ML IV SOLN
INTRAVENOUS | Status: AC
  Filled 2020-01-27: qty 100

## 2020-01-27 MED ORDER — ORAL CARE MOUTH RINSE: 15.0000 mL | Freq: Once | OROMUCOSAL | Status: AC

## 2020-01-27 MED ORDER — OXYCODONE HCL 5 MG PO TABS
10.0000 mg | ORAL_TABLET | ORAL | Status: DC | PRN
  Administered 2020-01-27 – 2020-01-28 (×2): 10 mg via ORAL
  Filled 2020-01-27: qty 2

## 2020-01-27 MED ORDER — DEXAMETHASONE SODIUM PHOSPHATE 10 MG/ML IJ SOLN
INTRAMUSCULAR | Status: AC
  Filled 2020-01-27: qty 1

## 2020-01-27 MED ORDER — ACETAMINOPHEN 10 MG/ML IV SOLN
INTRAVENOUS | Status: DC | PRN
  Administered 2020-01-27: 1000 mg via INTRAVENOUS

## 2020-01-27 MED ORDER — OXYCODONE HCL 5 MG PO TABS
5.0000 mg | ORAL_TABLET | ORAL | Status: DC | PRN
  Administered 2020-01-27: 5 mg via ORAL
  Filled 2020-01-27: qty 1

## 2020-01-27 MED ORDER — SENNOSIDES-DOCUSATE SODIUM 8.6-50 MG PO TABS: 2.0000 | ORAL_TABLET | ORAL | Status: DC

## 2020-01-27 MED ORDER — FENTANYL CITRATE (PF) 100 MCG/2ML IJ SOLN
INTRAMUSCULAR | Status: AC
  Administered 2020-01-27: 25 ug via INTRAVENOUS
  Filled 2020-01-27: qty 2

## 2020-01-27 MED ORDER — PROPOFOL 10 MG/ML IV BOLUS
INTRAVENOUS | Status: AC
  Filled 2020-01-27: qty 20

## 2020-01-27 MED ORDER — FENTANYL CITRATE (PF) 100 MCG/2ML IJ SOLN
INTRAMUSCULAR | Status: DC | PRN
  Administered 2020-01-27 (×2): 50 ug via INTRAVENOUS

## 2020-01-27 MED ORDER — SIMETHICONE 80 MG PO CHEW: 80.0000 mg | CHEWABLE_TABLET | ORAL | Status: DC | PRN

## 2020-01-27 MED ORDER — SUCCINYLCHOLINE CHLORIDE 20 MG/ML IJ SOLN
INTRAMUSCULAR | Status: DC | PRN
  Administered 2020-01-27: 140 mg via INTRAVENOUS

## 2020-01-27 MED ORDER — CEFAZOLIN SODIUM-DEXTROSE 2-4 GM/100ML-% IV SOLN
2.0000 g | INTRAVENOUS | Status: AC
  Administered 2020-01-27: 2 g via INTRAVENOUS

## 2020-01-27 MED ORDER — PROPOFOL 10 MG/ML IV BOLUS
INTRAVENOUS | Status: DC | PRN
  Administered 2020-01-27: 120 mg via INTRAVENOUS

## 2020-01-27 MED ORDER — LACTATED RINGERS IV SOLN: INTRAVENOUS | Status: DC

## 2020-01-27 MED ORDER — DEXAMETHASONE SODIUM PHOSPHATE 10 MG/ML IJ SOLN
INTRAMUSCULAR | Status: DC | PRN
  Administered 2020-01-27: 10 mg via INTRAVENOUS

## 2020-01-27 MED ORDER — ONDANSETRON HCL 4 MG/2ML IJ SOLN
INTRAMUSCULAR | Status: DC | PRN
  Administered 2020-01-27: 4 mg via INTRAVENOUS

## 2020-01-27 MED ORDER — POVIDONE-IODINE 10 % EX SWAB: 2.0000 "application " | Freq: Once | CUTANEOUS | Status: DC

## 2020-01-27 MED ORDER — BUPIVACAINE HCL 0.5 % IJ SOLN
INTRAMUSCULAR | Status: DC | PRN
  Administered 2020-01-27: 10 mL

## 2020-01-27 MED ORDER — MIDAZOLAM HCL 2 MG/2ML IJ SOLN
INTRAMUSCULAR | Status: AC
  Filled 2020-01-27: qty 2

## 2020-01-27 MED ORDER — BUPIVACAINE HCL (PF) 0.5 % IJ SOLN
INTRAMUSCULAR | Status: AC
  Filled 2020-01-27: qty 30

## 2020-01-27 MED ORDER — DIPHENHYDRAMINE HCL 25 MG PO CAPS: 25.0000 mg | ORAL_CAPSULE | Freq: Four times a day (QID) | ORAL | Status: DC | PRN

## 2020-01-27 MED ORDER — CHLORHEXIDINE GLUCONATE 0.12 % MT SOLN
OROMUCOSAL | Status: AC
  Filled 2020-01-27: qty 15

## 2020-01-27 MED ORDER — SUCCINYLCHOLINE CHLORIDE 200 MG/10ML IV SOSY
PREFILLED_SYRINGE | INTRAVENOUS | Status: AC
  Filled 2020-01-27: qty 10

## 2020-01-27 MED ORDER — EPHEDRINE 5 MG/ML INJ
INTRAVENOUS | Status: AC
  Filled 2020-01-27: qty 10

## 2020-01-27 MED ORDER — CHLORHEXIDINE GLUCONATE 0.12 % MT SOLN
15.0000 mL | Freq: Once | OROMUCOSAL | Status: AC
  Administered 2020-01-27: 15 mL via OROMUCOSAL

## 2020-01-27 MED ORDER — LIDOCAINE HCL (CARDIAC) PF 100 MG/5ML IV SOSY
PREFILLED_SYRINGE | INTRAVENOUS | Status: DC | PRN
  Administered 2020-01-27: 100 mg via INTRAVENOUS

## 2020-01-27 MED ORDER — ONDANSETRON HCL 4 MG/2ML IJ SOLN
INTRAMUSCULAR | Status: AC
  Filled 2020-01-27: qty 2

## 2020-01-27 MED ORDER — COCONUT OIL OIL
1.0000 "application " | TOPICAL_OIL | Status: DC | PRN
  Administered 2020-01-28: 1 via TOPICAL
  Filled 2020-01-27: qty 120

## 2020-01-27 MED ORDER — LACTATED RINGERS IV SOLN
INTRAVENOUS | Status: DC
  Administered 2020-01-27: 100 mL/h via INTRAVENOUS

## 2020-01-27 SURGICAL SUPPLY — 27 items
BLADE SURG SZ11 CARB STEEL (BLADE) ×3 IMPLANT
CHLORAPREP W/TINT 26 (MISCELLANEOUS) ×3 IMPLANT
DERMABOND ADVANCED (GAUZE/BANDAGES/DRESSINGS) ×2
DERMABOND ADVANCED .7 DNX12 (GAUZE/BANDAGES/DRESSINGS) ×1 IMPLANT
DRAPE LAPAROTOMY 100X77 ABD (DRAPES) ×3 IMPLANT
ELECT CAUTERY BLADE 6.4 (BLADE) ×3 IMPLANT
ELECT REM PT RETURN 9FT ADLT (ELECTROSURGICAL) ×3
ELECTRODE REM PT RTRN 9FT ADLT (ELECTROSURGICAL) ×1 IMPLANT
GLOVE BIOGEL PI IND STRL 6.5 (GLOVE) ×2 IMPLANT
GLOVE BIOGEL PI INDICATOR 6.5 (GLOVE) ×4
GLOVE SURG SYN 6.5 ES PF (GLOVE) ×6 IMPLANT
GOWN STRL REUS W/ TWL LRG LVL3 (GOWN DISPOSABLE) ×2 IMPLANT
GOWN STRL REUS W/TWL LRG LVL3 (GOWN DISPOSABLE) ×4
LABEL OR SOLS (LABEL) ×3 IMPLANT
MANIFOLD NEPTUNE II (INSTRUMENTS) ×3 IMPLANT
NEEDLE HYPO 22GX1.5 SAFETY (NEEDLE) ×3 IMPLANT
NS IRRIG 500ML POUR BTL (IV SOLUTION) ×3 IMPLANT
PACK BASIN MINOR (MISCELLANEOUS) ×3 IMPLANT
RETRACTOR RING XSMALL (MISCELLANEOUS) ×1 IMPLANT
RTRCTR WOUND ALEXIS 13CM XS SH (MISCELLANEOUS) ×3
SPONGE LAP 4X18 RFD (DISPOSABLE) ×3 IMPLANT
SUT CHROMIC 0 CT 1 (SUTURE) ×6 IMPLANT
SUT MNCRL 4-0 (SUTURE) ×2
SUT MNCRL 4-0 27XMFL (SUTURE) ×1
SUT VICRYL 0 AB UR-6 (SUTURE) ×3 IMPLANT
SUTURE MNCRL 4-0 27XMF (SUTURE) ×1 IMPLANT
SYR 10ML LL (SYRINGE) ×3 IMPLANT

## 2020-01-27 NOTE — Anesthesia Procedure Notes (Signed)
Procedure Name: Intubation Date/Time: 01/27/2020 8:59 AM Performed by: Hezzie Bump, CRNA Pre-anesthesia Checklist: Patient identified, Patient being monitored, Timeout performed, Emergency Drugs available and Suction available Patient Re-evaluated:Patient Re-evaluated prior to induction Oxygen Delivery Method: Circle system utilized Preoxygenation: Pre-oxygenation with 100% oxygen Induction Type: IV induction, Rapid sequence and Cricoid Pressure applied Laryngoscope Size: 3 and McGraph Grade View: Grade I Tube type: Oral Tube size: 6.5 mm Number of attempts: 1 Airway Equipment and Method: Stylet Placement Confirmation: ETT inserted through vocal cords under direct vision,  positive ETCO2 and breath sounds checked- equal and bilateral Secured at: 20 cm Tube secured with: Tape Dental Injury: Teeth and Oropharynx as per pre-operative assessment

## 2020-01-27 NOTE — Transfer of Care (Signed)
Immediate Anesthesia Transfer of Care Note  Patient: Abigail Mejia  Procedure(s) Performed: POST PARTUM TUBAL LIGATION (Bilateral )  Patient Location: PACU  Anesthesia Type:General  Level of Consciousness: drowsy  Airway & Oxygen Therapy: Patient Spontanous Breathing and Patient connected to face mask oxygen  Post-op Assessment: Report given to RN and Post -op Vital signs reviewed and stable  Post vital signs: Reviewed and stable  Last Vitals:  Vitals Value Taken Time  BP 137/76 01/27/20 1010  Temp    Pulse 104 01/27/20 1012  Resp 19 01/27/20 1012  SpO2 95 % 01/27/20 1012  Vitals shown include unvalidated device data.  Last Pain:  Vitals:   01/27/20 0831  TempSrc: Tympanic  PainSc: 0-No pain      Patients Stated Pain Goal: 0 (01/27/20 0831)  Complications: No complications documented.

## 2020-01-27 NOTE — Op Note (Signed)
Operative Note  01/27/2020  PRE-OP DIAGNOSIS: Desire for permanent sterilization  POST-OP DIAGNOSIS: same  SURGEON: Averil Digman MD  PROCEDURE: Postpartum Bilateral Tubal Ligation   ANESTHESIA: Choice   ESTIMATED BLOOD LOSS: 5 cc  SPECIMENS: Portion of left and right tube  COMPLICATIONS: None  DISPOSITION: PACU - hemodynamically stable.  CONDITION: stable  FINDINGS: Exam under anesthesia revealed small, mobile 20 cm uterus with no masses and bilateral adnexa without masses or fullness.   TECHNIQUE:  The patient was  prepped and draped in usual sterile fashion after adequate anesthesia is obtained in the supine position on the operating room table.  Local anesthesia is injected into the skin just inferior to the umbilicus, followed by a small elliptical incision with a scapel.  Fascia is identified and tented upwards, and an incision is made with Mayo scissors.  Identification of no adherent bowel is made. Retractors are placed and trendelenburg positioning is achieved.    The left Fallopian tube was identified, grasped with the Babcock clamps, lifted to the skin incision and followed out distally to the fimbriae. An avascular midsection of the tube approximately 3-4cm from the cornua was grasped with the babcock clamps and brought into a knuckle at the skin incision. The tube was double ligated with 0-0 Chromic suture and the intervening portion of tube was transected and removed. Excellent hemostasis was noted and the tube was returned to the abdomen. Attention was then turned to the right fallopian tube after confirmation of identification by tracing the tube out to the fimbriae. The same procedure was then performed on the right Fallopian tube. Again, excellent hemostasis was noted at the end of the procedure.  Retractors are removed and fascia closed with a 0-0 Vicryl suture. Irrigation and hemostasis confirmed.  Skin closed with a 4-0 monocryl suture in a subcuticular  fashion followed by skin adhesive.  Pt goes to recovery room in stable fashion.  All counts correct times 2.  Adelene Idler MD [facility], [facility] 01/27/2020 10:16 AM

## 2020-01-27 NOTE — Interval H&P Note (Signed)
History and Physical Interval Note:  01/27/2020 8:24 AM  Abigail Mejia  has presented today for surgery, with the diagnosis of desires sterilization.  The various methods of treatment have been discussed with the patient and family. After consideration of risks, benefits and other options for treatment, the patient has consented to  Procedure(s): POST PARTUM TUBAL LIGATION (Bilateral) as a surgical intervention.  The patient's history has been reviewed, patient examined, no change in status, stable for surgery.  I have reviewed the patient's chart and labs.  Questions were answered to the patient's satisfaction.     Naftoli Penny R Angelic Schnelle

## 2020-01-27 NOTE — Progress Notes (Signed)
Patient to OR via bed with orderly.

## 2020-01-27 NOTE — Anesthesia Postprocedure Evaluation (Signed)
Anesthesia Post Note  Patient: [place] D Tweed  Procedure(s) Performed: POST PARTUM TUBAL LIGATION (Bilateral )  Patient location during evaluation: Mother Baby Anesthesia Type: Epidural Level of consciousness: awake and alert Pain management: pain level controlled Vital Signs Assessment: post-procedure vital signs reviewed and stable Respiratory status: spontaneous breathing, nonlabored ventilation and respiratory function stable Cardiovascular status: stable Postop Assessment: no headache, no backache and epidural receding Anesthetic complications: no   No complications documented.   Last Vitals:  Vitals:   01/27/20 0145 01/27/20 0311  BP: 113/64 (!) 118/57  Pulse: 66 71  Resp: 18 18  Temp: 36.5 C 37 C  SpO2: 98% 99%    Last Pain:  Vitals:   01/27/20 0311  TempSrc: Oral  PainSc:                  Elmarie Mainland

## 2020-01-28 ENCOUNTER — Encounter: Payer: Self-pay | Admitting: Obstetrics and Gynecology

## 2020-01-28 MED ORDER — IBUPROFEN 600 MG PO TABS
600.0000 mg | ORAL_TABLET | Freq: Four times a day (QID) | ORAL | 0 refills | Status: DC
Start: 2020-01-28 — End: 2021-05-27

## 2020-01-28 MED ORDER — OXYCODONE HCL 5 MG PO TABS: 5.0000 mg | ORAL_TABLET | Freq: Four times a day (QID) | ORAL | 0 refills | Status: AC | PRN

## 2020-01-28 NOTE — Anesthesia Postprocedure Evaluation (Signed)
Anesthesia Post Note  Patient: Abigail Mejia  Procedure(s) Performed: POST PARTUM TUBAL LIGATION (Bilateral )  Patient location during evaluation: Women's Unit Anesthesia Type: General Level of consciousness: awake and alert Pain management: pain level controlled Vital Signs Assessment: post-procedure vital signs reviewed and stable Respiratory status: spontaneous breathing, nonlabored ventilation, respiratory function stable and patient connected to nasal cannula oxygen Cardiovascular status: blood pressure returned to baseline and stable Postop Assessment: no apparent nausea or vomiting Anesthetic complications: no   No complications documented.   Last Vitals:  Vitals:   01/27/20 2330 01/28/20 0824  BP: 108/61 108/65  Pulse: 63   Resp: 16 18  Temp: 36.9 C 36.8 C  SpO2: 98% 99%    Last Pain:  Vitals:   01/28/20 0615  TempSrc:   PainSc: 6                  Corinda Gubler

## 2020-01-28 NOTE — Progress Notes (Signed)
Reviewed D/C instructions with pt and family. Pt verbalized understanding of teaching. Discharged to home via W/C. Pt to schedule f/u appt.  

## 2020-01-30 LAB — SURGICAL PATHOLOGY

## 2020-01-30 NOTE — Anesthesia Postprocedure Evaluation (Signed)
Anesthesia Post Note  Patient: [place] D Trieu  Procedure(s) Performed: POST PARTUM TUBAL LIGATION (Bilateral )  Patient location during evaluation: PACU Anesthesia Type: General Level of consciousness: awake and alert Pain management: pain level controlled Vital Signs Assessment: post-procedure vital signs reviewed and stable Respiratory status: spontaneous breathing, nonlabored ventilation, respiratory function stable and patient connected to nasal cannula oxygen Cardiovascular status: blood pressure returned to baseline and stable Postop Assessment: no apparent nausea or vomiting Anesthetic complications: no   No complications documented.   Last Vitals:  Vitals:   01/27/20 2330 01/28/20 0824  BP: 108/61 108/65  Pulse: 63   Resp: 16 18  Temp: 36.9 C 36.8 C  SpO2: 98% 99%    Last Pain:  Vitals:   01/28/20 1006  TempSrc:   PainSc: 5                  Yevette Edwards

## 2020-01-30 NOTE — Anesthesia Preprocedure Evaluation (Signed)
Anesthesia Evaluation  Patient identified by MRN, date of birth, ID band Patient awake    Reviewed: Allergy & Precautions, H&P , NPO status , Patient's Chart, lab work & pertinent test results, reviewed documented beta blocker date and time   Airway Mallampati: II  TM Distance: >3 FB Neck ROM: full    Dental  (+) Teeth Intact   Pulmonary neg pulmonary ROS,    Pulmonary exam normal        Cardiovascular negative cardio ROS Normal cardiovascular exam Rhythm:regular Rate:Normal     Neuro/Psych negative neurological ROS  negative psych ROS   GI/Hepatic negative GI ROS, Neg liver ROS,   Endo/Other  Morbid obesity  Renal/GU negative Renal ROS  negative genitourinary   Musculoskeletal   Abdominal   Peds  Hematology negative hematology ROS (+)   Anesthesia Other Findings Past Medical History: 12/21/2015: Chronic low back pain without sciatica No date: Medical history non-contributory Past Surgical History: 05/13/2018: ETHMOIDECTOMY; Bilateral     Comment:  Procedure: ETHMOIDECTOMY;  Surgeon: Vernie Murders, MD;                Location: [facility];  Service: ENT;                Laterality: Bilateral; 05/13/2018: IMAGE GUIDED SINUS SURGERY; Bilateral     Comment:  Procedure: IMAGE GUIDED SINUS SURGERY WITH PROPEL               IMPLANT;  Surgeon: Vernie Murders, MD;  Location: [facility];  Service: ENT;  Laterality: Bilateral;                stryker disk needed PROPEL IMPLANT PLACEMENT GAVE DISK               TO CECE 2-28 KP 05/13/2018: MAXILLARY ANTROSTOMY; Bilateral     Comment:  Procedure: MAXILLARY ANTROSTOMY removal of tissue;                Surgeon: Vernie Murders, MD;  Location: [facility];  Service: ENT;  Laterality: Bilateral; 05/13/2018: NASAL SINUS SURGERY; Bilateral     Comment:  Procedure: FRONTAL SINUSOTOMY;  Surgeon: Vernie Murders,               MD;   Location: [facility];  Service: ENT;                Laterality: Bilateral; 2012;10/21/2013;: nexplanon insertion 05/13/2018: SPHENOIDECTOMY; Bilateral     Comment:  Procedure: SPHENOIDECTOMY with tissue removal;  Surgeon:              Vernie Murders, MD;  Location: [facility];                Service: ENT;  Laterality: Bilateral; No date: TOOTH EXTRACTION 01/27/2020: TUBAL LIGATION; Bilateral     Comment:  Procedure: POST PARTUM TUBAL LIGATION;  Surgeon:               Natale Milch, MD;  Location: [facility] OR[facility];  Service:              Gynecology;  Laterality: Bilateral; 01/26/2020: VAGINAL DELIVERY BMI    Body Mass Index: 41.20 kg/m     Reproductive/Obstetrics negative OB ROS  Anesthesia Physical Anesthesia Plan  ASA: III  Anesthesia Plan: General ETT   Post-op Pain Management:    Induction:   PONV Risk Score and Plan:   Airway Management Planned:   Additional Equipment:   Intra-op Plan:   Post-operative Plan:   Informed Consent: I have reviewed the patients History and Physical, chart, labs and discussed the procedure including the risks, benefits and alternatives for the proposed anesthesia with the patient or authorized representative who has indicated his/her understanding and acceptance.     Dental Advisory Given  Plan Discussed with: CRNA  Anesthesia Plan Comments:         Anesthesia Quick Evaluation

## 2020-02-01 ENCOUNTER — Encounter: Payer: Medicaid Other | Admitting: Obstetrics and Gynecology

## 2020-03-07 ENCOUNTER — Encounter: Payer: Self-pay | Admitting: Obstetrics

## 2020-03-07 ENCOUNTER — Other Ambulatory Visit: Payer: Self-pay

## 2020-03-07 ENCOUNTER — Ambulatory Visit (INDEPENDENT_AMBULATORY_CARE_PROVIDER_SITE_OTHER): Payer: Medicaid Other | Admitting: Obstetrics

## 2020-03-07 ENCOUNTER — Other Ambulatory Visit (HOSPITAL_COMMUNITY)
Admission: RE | Admit: 2020-03-07 | Discharge: 2020-03-07 | Disposition: A | Discharge status: Home / Self Care | Payer: Medicaid Other | Source: Ambulatory Visit | Attending: Obstetrics | Admitting: Obstetrics

## 2020-03-07 DIAGNOSIS — Z124 Encounter for screening for malignant neoplasm of cervix: Secondary | ICD-10-CM | POA: Insufficient documentation

## 2020-03-07 NOTE — Progress Notes (Signed)
Postpartum Visit  Chief Complaint:  Chief Complaint  Patient presents with   Postpartum Care     6 week...had tubal ligation     History of Present Illness: Abigail Mejia is a 30 y.o. [phone] presents for postpartum visit.  Date of delivery: 01/25/2020  Type of delivery: Vaginal delivery - Vacuum or forceps assisted  no Episiotomy No.  Laceration: yes labial lacerations not reparied Pregnancy or labor problems:  no Any problems since the delivery:  no  Newborn Details:  SINGLETON :  1. Baby's name: August. Birth weight: 6lbs 12 oz Maternal Details:  Breast Feeding:  yes Post partum depression/anxiety noted:  no Edinburgh Post-Partum Depression Score:  5  Date of last PAP: 2019  normal   Review of Systems: Review of Systems  Constitutional: Negative.   HENT: Negative.   Eyes: Negative.   Respiratory: Negative.   Cardiovascular: Negative.   Gastrointestinal: Negative.   Genitourinary: Negative.   Musculoskeletal: Negative.   Skin: Negative.   Neurological: Negative.   Endo/Heme/Allergies: Negative.   Psychiatric/Behavioral: Negative.     Past Medical History:  Past Medical History:  Diagnosis Date   Chronic low back pain without sciatica 12/21/2015   Medical history non-contributory     Past Surgical History:  Past Surgical History:  Procedure Laterality Date   ETHMOIDECTOMY Bilateral 05/13/2018   Procedure: ETHMOIDECTOMY;  Surgeon: Vernie Murders, MD;  Location: [facility];  Service: ENT;  Laterality: Bilateral;   IMAGE GUIDED SINUS SURGERY Bilateral 05/13/2018   Procedure: IMAGE GUIDED SINUS SURGERY WITH PROPEL IMPLANT;  Surgeon: Vernie Murders, MD;  Location: [facility];  Service: ENT;  Laterality: Bilateral;  stryker disk needed PROPEL IMPLANT PLACEMENT GAVE DISK TO CECE 2-28 KP   MAXILLARY ANTROSTOMY Bilateral 05/13/2018   Procedure: MAXILLARY ANTROSTOMY removal of tissue;  Surgeon: Vernie Murders, MD;  Location: [facility];  Service: ENT;  Laterality: Bilateral;   NASAL SINUS SURGERY Bilateral 05/13/2018   Procedure: FRONTAL SINUSOTOMY;  Surgeon: Vernie Murders, MD;  Location: [facility];  Service: ENT;  Laterality: Bilateral;   nexplanon insertion  2012;10/21/2013;   SPHENOIDECTOMY Bilateral 05/13/2018   Procedure: SPHENOIDECTOMY with tissue removal;  Surgeon: Vernie Murders, MD;  Location: [facility];  Service: ENT;  Laterality: Bilateral;   TOOTH EXTRACTION     TUBAL LIGATION Bilateral 01/27/2020   Procedure: POST PARTUM TUBAL LIGATION;  Surgeon: Natale Milch, MD;  Location: [facility] ORS;  Service: Gynecology;  Laterality: Bilateral;   VAGINAL DELIVERY  01/26/2020    Family History:  No family history on file.  Social History:  Social History   Socioeconomic History   Marital status: Single    Spouse name: Not on file   Number of children: 2   Years of education: 12   Highest education level: Not on file  Occupational History   Occupation: [employer]  Tobacco Use   Smoking status: Never Smoker   Smokeless tobacco: Never Used  Building services engineer Use: Never used  Substance and Sexual Activity   Alcohol use: No   Drug use: No   Sexual activity: Yes    Birth control/protection: Surgical  Other Topics Concern   Not on file  Social History Narrative   Not on file   Social Determinants of Health   Financial Resource Strain: Not on file  Food Insecurity: Not on file  Transportation Needs: Not on file  Physical Activity: Not on file  Stress: Not  on file  Social Connections: Not on file  Intimate Partner Violence: Not on file    Allergies:  Not on File  Medications: Prior to Admission medications   Medication Sig Start Date End Date Taking? Authorizing Provider  cetirizine (ZYRTEC) 10 MG tablet Take 10 mg by mouth daily.  11/18/18  Yes [provider]  fluticasone (FLONASE) 50 MCG/ACT nasal spray Place 2 sprays into both  nostrils daily. 07/24/19  Yes [provider]  ibuprofen (ADVIL) 600 MG tablet Take 1 tablet (600 mg total) by mouth every 6 (six) hours. 01/28/20  Yes [name], CNM  montelukast (SINGULAIR) 10 MG tablet Take 10 mg by mouth daily. 11/18/18  Yes [provider]  Prenatal Vit-Fe Fumarate-FA (PRENATAL VITAMINS) 28-0.8 MG TABS Take 1 tablet by mouth daily.   Yes [provider]  PROAIR HFA 108 (90 Base) MCG/ACT inhaler Place 2 puffs into the nose every 6 (six) hours as needed for wheezing or shortness of breath.  06/12/17  Yes [provider]  cyclobenzaprine (FLEXERIL) 5 MG tablet Take 5 mg by mouth 2 (two) times daily as needed. Patient not taking: No sig reported 09/30/19   [provider]  pantoprazole (PROTONIX) 40 MG tablet Take 40 mg by mouth daily. Patient not taking: Reported on 03/07/2020 09/30/19   [provider]    Physical Exam Vitals:  Vitals:   03/07/20 1459  BP: 118/76    General: NAD HEENT: normocephalic, anicteric Neck: No thyroid enlargement, no palpable nodules, no cervical lymphadenpathy Breast: Lactating, no inflammation, no masses, nipples intact Pulmonary: No increased work of breathing, CTAB Abdomen: Soft, non-tender, non-distended.  Umbilicus without lesions.  No hepatomegaly or masses palpable. No evidence of hernia. Genitourinary:  External: Well healed perineum, no lesions or inflammation    Vagina: Normal vaginal mucosa, no evidence of prolapse.    Cervix: Grossly normal in appearance, no bleeding  Uterus: Well involuted, mobile, non-tender  Adnexa: No adnexal masses, non-tender  Rectal: deferred Extremities: no edema, erythema, or tenderness Neurologic: Grossly intact Psychiatric: mood appropriate, affect full  Assessment: 30 y.o. [id number] presenting for 6 week postpartum visit S/P BTL  Plan:   1) Contraception Education not needed as she has had a BTL 2)  Pap done today - ASCCP guidelines  and rational discussed.  Patient opts for 3 year screening interval.  3) Patient underwent screening for postpartum depression with no concerns noted. Edinburgh score 5  4) Discussed return to normal activity, recommend continuing prenatal vitamins.  5) Follow up 1 year for routine annual exam.  [name], CNM  03/07/2020 3:30 PM

## 2020-03-08 ENCOUNTER — Ambulatory Visit: Payer: Medicaid Other | Admitting: Obstetrics

## 2020-03-14 LAB — CYTOLOGY - PAP
Comment: NEGATIVE
Diagnosis: NEGATIVE
Diagnosis: REACTIVE
High risk HPV: NEGATIVE

## 2020-03-16 ENCOUNTER — Encounter: Payer: Self-pay | Admitting: Obstetrics

## 2020-10-29 ENCOUNTER — Ambulatory Visit (INDEPENDENT_AMBULATORY_CARE_PROVIDER_SITE_OTHER): Payer: Medicaid Other | Admitting: Urology

## 2020-10-29 ENCOUNTER — Other Ambulatory Visit: Payer: Self-pay

## 2020-10-29 ENCOUNTER — Encounter: Payer: Self-pay | Admitting: Urology

## 2020-10-29 VITALS — BP 107/72 | HR 88

## 2020-10-29 DIAGNOSIS — N390 Urinary tract infection, site not specified: Secondary | ICD-10-CM

## 2020-10-29 DIAGNOSIS — N3020 Other chronic cystitis without hematuria: Secondary | ICD-10-CM | POA: Diagnosis not present

## 2020-10-29 LAB — URINALYSIS, COMPLETE
Bilirubin, UA: NEGATIVE
Glucose, UA: NEGATIVE
Leukocytes,UA: NEGATIVE
Nitrite, UA: NEGATIVE
Protein,UA: NEGATIVE
RBC, UA: NEGATIVE
Specific Gravity, UA: 1.02 (ref 1.005–1.030)
Urobilinogen, Ur: 1 mg/dL (ref 0.2–1.0)
pH, UA: 7 (ref 5.0–7.5)

## 2020-10-29 LAB — MICROSCOPIC EXAMINATION: Bacteria, UA: NONE SEEN

## 2020-10-29 MED ORDER — TRIMETHOPRIM 100 MG PO TABS: 100.0000 mg | ORAL_TABLET | Freq: Every day | ORAL | 11 refills | Status: DC

## 2020-10-29 NOTE — Addendum Note (Signed)
Addended by: Milas Kocher A on: 10/29/2020 03:25 PM   Modules accepted: Orders

## 2020-10-29 NOTE — Progress Notes (Signed)
10/29/2020 3:07 PM   Abigail Mejia 06/09/1990 [id number]  Referring provider: Sedonia Small, MD [address] [place],  [place] [address]  Chief Complaint  Patient presents with   Urinary Tract Infection    HPI: I was consulted to assist with recurrent bladder infections.  She gets burning and urgency that respond temporarily antibiotics.  She thinks he still infected and just finished 2 courses of an antibiotic.  She thinks he gets 6 infections a year  At baseline she voids every 3 hours and sometimes gets up once at night.  Recently she started having little urge incontinence wearing a liner but otherwise was continent prior to this.  No history of kidney stones bladder surgery.  No hysterectomy.  No neurologic issues   PMH: Past Medical History:  Diagnosis Date   Chronic low back pain without sciatica 12/21/2015   Medical history non-contributory    Supervision of high risk pregnancy, antepartum 06/07/2019   [facility] Prenatal Labs Dating  LMP = 8wk Blood type: O/Positive/-- (04/16 1452)  Genetic Screen NIPS: Normal xy Antibody:Negative (04/16 1452) Anatomic [place] complete Rubella: 1.86 (04/16 1452)  Varicella: immune GTT Early:       77         Third trimester: 94 RPR: Non Reactive (09/03 0939)  Rhogam Not needed HBsAg: Negative (04/16 1452)  Vaccines TDAP: 11/18/19                     Flu Shot: HI    Surgical History: Past Surgical History:  Procedure Laterality Date   ETHMOIDECTOMY Bilateral 05/13/2018   Procedure: ETHMOIDECTOMY;  Surgeon: Vernie Murders, MD;  Location: [facility];  Service: ENT;  Laterality: Bilateral;   IMAGE GUIDED SINUS SURGERY Bilateral 05/13/2018   Procedure: IMAGE GUIDED SINUS SURGERY WITH PROPEL IMPLANT;  Surgeon: Vernie Murders, MD;  Location: [facility];  Service: ENT;  Laterality: Bilateral;  stryker disk needed PROPEL IMPLANT PLACEMENT GAVE DISK TO CECE 2-28 KP   MAXILLARY ANTROSTOMY Bilateral 05/13/2018   Procedure: MAXILLARY  ANTROSTOMY removal of tissue;  Surgeon: Vernie Murders, MD;  Location: [facility];  Service: ENT;  Laterality: Bilateral;   NASAL SINUS SURGERY Bilateral 05/13/2018   Procedure: FRONTAL SINUSOTOMY;  Surgeon: Vernie Murders, MD;  Location: [facility];  Service: ENT;  Laterality: Bilateral;   nexplanon insertion  2012;10/21/2013;   SPHENOIDECTOMY Bilateral 05/13/2018   Procedure: SPHENOIDECTOMY with tissue removal;  Surgeon: Vernie Murders, MD;  Location: [facility];  Service: ENT;  Laterality: Bilateral;   TOOTH EXTRACTION     TUBAL LIGATION Bilateral 01/27/2020   Procedure: POST PARTUM TUBAL LIGATION;  Surgeon: Natale Milch, MD;  Location: [facility] ORS;  Service: Gynecology;  Laterality: Bilateral;   VAGINAL DELIVERY  01/26/2020    Home Medications:  Allergies as of 10/29/2020   Not on File      Medication List        Accurate as of October 29, 2020  3:07 PM. If you have any questions, ask your nurse or doctor.          cetirizine 10 MG tablet Commonly known as: ZYRTEC Take 10 mg by mouth daily.   cyclobenzaprine 5 MG tablet Commonly known as: FLEXERIL Take 5 mg by mouth 2 (two) times daily as needed.   fluticasone 50 MCG/ACT nasal spray Commonly known as: FLONASE Place 2 sprays into both nostrils daily.   ibuprofen 600 MG tablet Commonly known as: ADVIL Take 1 tablet (  600 mg total) by mouth every 6 (six) hours.   montelukast 10 MG tablet Commonly known as: SINGULAIR Take 10 mg by mouth daily.   pantoprazole 40 MG tablet Commonly known as: PROTONIX Take 40 mg by mouth daily.   Prenatal Vitamins 28-0.8 MG Tabs Take 1 tablet by mouth daily.   ProAir HFA 108 (90 Base) MCG/ACT inhaler Generic drug: albuterol Place 2 puffs into the nose every 6 (six) hours as needed for wheezing or shortness of breath.        Allergies: Not on File  Family History: No family history on file.  Social History:  reports that she has never smoked. She  has never used smokeless tobacco. She reports that she does not drink alcohol and does not use drugs.  ROS:                                        Physical Exam: There were no vitals taken for this visit.  Constitutional:  Alert and oriented, No acute distress. HEENT: [place] AT, moist mucus membranes.  Trachea midline, no masses. Cardiovascular: No clubbing, cyanosis, or edema. Respiratory: Normal respiratory effort, no increased work of breathing. GI: Abdomen is soft, nontender, nondistended, no abdominal masses GU: No prolapse or stress incontinence or urethral diverticulum Skin: No rashes, bruises or suspicious lesions. Lymph: No cervical or inguinal adenopathy. Neurologic: Grossly intact, no focal deficits, moving all 4 extremities. Psychiatric: Normal mood and affect.  Laboratory Data: Lab Results  Component Value Date   WBC 10.0 01/27/2020   HGB 11.0 (L) 01/27/2020   HCT 31.3 (L) 01/27/2020   MCV 90.7 01/27/2020   PLT 156 01/27/2020    Lab Results  Component Value Date   CREATININE 0.63 05/17/2019    No results found for: PSA  No results found for: TESTOSTERONE  No results found for: HGBA1C  Urinalysis    Component Value Date/Time   COLORURINE YELLOW (A) 03/15/2018 1658   APPEARANCEUR CLEAR (A) 03/15/2018 1658   APPEARANCEUR Hazy 03/09/2013 1939   LABSPEC 1.027 03/15/2018 1658   LABSPEC 1.026 03/09/2013 1939   PHURINE 6.0 03/15/2018 1658   GLUCOSEU Negative 01/09/2020 0852   GLUCOSEU Negative 03/09/2013 1939   HGBUR NEGATIVE 03/15/2018 1658   BILIRUBINUR neg 07/15/2019 0819   BILIRUBINUR Negative 03/09/2013 1939   KETONESUR 20 (A) 03/15/2018 1658   PROTEINUR Negative 07/15/2019 0819   PROTEINUR 30 (A) 03/15/2018 1658   UROBILINOGEN 0.2 07/15/2019 0819   NITRITE neg 07/15/2019 0819   NITRITE NEGATIVE 03/15/2018 1658   LEUKOCYTESUR Moderate (2+) (A) 07/15/2019 0819   LEUKOCYTESUR Negative 03/09/2013 1939    Pertinent  Imaging: Urine reviewed.  Urine sent for culture.  Chart reviewed.  Assessment & Plan: Pathophysiology of chronic cystitis discussed.  Return on trimethoprim suppression therapy in 6 to 7 weeks.  Call if renal ultrasound abnormal.  I am suspect that her urgency incontinence may improve on suppression therapy.  1. Acute UTI  - Urinalysis, Complete - CULTURE, URINE COMPREHENSIVE   No follow-ups on file.  Martina Sinner, MD  [facility] [address], [address] [place], [place] [address] [phone]

## 2020-11-02 ENCOUNTER — Telehealth: Payer: Self-pay

## 2020-11-02 LAB — CULTURE, URINE COMPREHENSIVE

## 2020-11-02 MED ORDER — SULFAMETHOXAZOLE-TRIMETHOPRIM 800-160 MG PO TABS: 1.0000 | ORAL_TABLET | Freq: Two times a day (BID) | ORAL | 0 refills | Status: AC

## 2020-11-02 NOTE — Telephone Encounter (Signed)
Dr Apolinar Junes reviewed urine culture. As per dr Apolinar Junes start bactrim ds BID x 5 days. Also  have pt stop taking trimethoprim while taking bactrim. Pt aware and verbalized understanding. Medication sent to pharmacy.

## 2020-11-09 ENCOUNTER — Other Ambulatory Visit: Payer: Self-pay

## 2020-11-09 ENCOUNTER — Ambulatory Visit
Admission: RE | Admit: 2020-11-09 | Discharge: 2020-11-09 | Disposition: A | Discharge status: Home / Self Care | Payer: Medicaid Other | Source: Ambulatory Visit | Attending: Urology | Admitting: Urology

## 2020-11-09 DIAGNOSIS — N390 Urinary tract infection, site not specified: Secondary | ICD-10-CM | POA: Diagnosis not present

## 2020-12-10 ENCOUNTER — Ambulatory Visit (INDEPENDENT_AMBULATORY_CARE_PROVIDER_SITE_OTHER): Payer: Medicaid Other | Admitting: Urology

## 2020-12-10 ENCOUNTER — Encounter: Payer: Self-pay | Admitting: Urology

## 2020-12-10 ENCOUNTER — Other Ambulatory Visit: Payer: Self-pay

## 2020-12-10 VITALS — BP 115/73 | HR 61 | Ht 64.0 in | Wt 227.0 lb

## 2020-12-10 DIAGNOSIS — N3020 Other chronic cystitis without hematuria: Secondary | ICD-10-CM

## 2020-12-10 LAB — URINALYSIS, COMPLETE
Bilirubin, UA: NEGATIVE
Glucose, UA: NEGATIVE
Ketones, UA: NEGATIVE
Leukocytes,UA: NEGATIVE
Nitrite, UA: NEGATIVE
Protein,UA: NEGATIVE
RBC, UA: NEGATIVE
Specific Gravity, UA: 1.02 (ref 1.005–1.030)
Urobilinogen, Ur: 0.2 mg/dL (ref 0.2–1.0)
pH, UA: 6 (ref 5.0–7.5)

## 2020-12-10 LAB — MICROSCOPIC EXAMINATION

## 2020-12-10 NOTE — Progress Notes (Signed)
12/10/2020 11:26 AM   Abigail Mejia 01-Jan-1991 [id number]  Referring provider: Evelene Croon, MD [place] [address] [place],  [place] [address]  Chief Complaint  Patient presents with   Follow-up    HPI: I was consulted to assist with recurrent bladder infections.  She gets burning and urgency that respond temporarily antibiotics.  She thinks he still infected and just finished 2 courses of an antibiotic.  She thinks he gets 6 infections a year  At baseline she voids every 3 hours and sometimes gets up once at night.  Recently she started having little urge incontinence wearing a liner but otherwise was continent prior to this.    No prolapse or stress incontinence or urethral diverticulum  Pathophysiology of chronic cystitis discussed.  Return on trimethoprim suppression therapy in 6 to 7 weeks.  Call if renal ultrasound abnormal.  I am suspect that her urgency incontinence may improve on suppression therapy.  Today Frequency stable.  Last culture low colony count positive.  Ultrasound normal Clinically not infected.  Urine sent for culture.   PMH: Past Medical History:  Diagnosis Date   Chronic low back pain without sciatica 12/21/2015   Medical history non-contributory    Supervision of high risk pregnancy, antepartum 06/07/2019   [facility] Prenatal Labs Dating  LMP = 8wk Blood type: O/Positive/-- (04/16 1452)  Genetic Screen NIPS: Normal xy Antibody:Negative (04/16 1452) Anatomic [place] complete Rubella: 1.86 (04/16 1452)  Varicella: immune GTT Early:       77         Third trimester: 94 RPR: Non Reactive (09/03 0939)  Rhogam Not needed HBsAg: Negative (04/16 1452)  Vaccines TDAP: 11/18/19                     Flu Shot: HI    Surgical History: Past Surgical History:  Procedure Laterality Date   ETHMOIDECTOMY Bilateral 05/13/2018   Procedure: ETHMOIDECTOMY;  Surgeon: Vernie Murders, MD;  Location: [facility];  Service: ENT;  Laterality: Bilateral;   IMAGE GUIDED SINUS  SURGERY Bilateral 05/13/2018   Procedure: IMAGE GUIDED SINUS SURGERY WITH PROPEL IMPLANT;  Surgeon: Vernie Murders, MD;  Location: [facility];  Service: ENT;  Laterality: Bilateral;  stryker disk needed PROPEL IMPLANT PLACEMENT GAVE DISK TO CECE 2-28 KP   MAXILLARY ANTROSTOMY Bilateral 05/13/2018   Procedure: MAXILLARY ANTROSTOMY removal of tissue;  Surgeon: Vernie Murders, MD;  Location: [facility];  Service: ENT;  Laterality: Bilateral;   NASAL SINUS SURGERY Bilateral 05/13/2018   Procedure: FRONTAL SINUSOTOMY;  Surgeon: Vernie Murders, MD;  Location: [facility];  Service: ENT;  Laterality: Bilateral;   nexplanon insertion  2012;10/21/2013;   SPHENOIDECTOMY Bilateral 05/13/2018   Procedure: SPHENOIDECTOMY with tissue removal;  Surgeon: Vernie Murders, MD;  Location: [facility];  Service: ENT;  Laterality: Bilateral;   TOOTH EXTRACTION     TUBAL LIGATION Bilateral 01/27/2020   Procedure: POST PARTUM TUBAL LIGATION;  Surgeon: Natale Milch, MD;  Location: [facility] ORS;  Service: Gynecology;  Laterality: Bilateral;   VAGINAL DELIVERY  01/26/2020    Home Medications:  Allergies as of 12/10/2020   Not on File      Medication List        Accurate as of December 10, 2020 11:26 AM. If you have any questions, ask your nurse or doctor.          cetirizine 10 MG tablet Commonly known as: ZYRTEC Take 10 mg by mouth daily.  fluticasone 50 MCG/ACT nasal spray Commonly known as: FLONASE Place 2 sprays into both nostrils daily.   ibuprofen 600 MG tablet Commonly known as: ADVIL Take 1 tablet (600 mg total) by mouth every 6 (six) hours.   meloxicam 7.5 MG tablet Commonly known as: MOBIC Take 7.5 mg by mouth daily.   montelukast 10 MG tablet Commonly known as: SINGULAIR Take 10 mg by mouth daily.   Prenatal Vitamins 28-0.8 MG Tabs Take 1 tablet by mouth daily.   ProAir HFA 108 (90 Base) MCG/ACT inhaler Generic drug: albuterol Place 2 puffs into  the nose every 6 (six) hours as needed for wheezing or shortness of breath.   trimethoprim 100 MG tablet Commonly known as: TRIMPEX Take 1 tablet (100 mg total) by mouth daily.        Allergies: Not on File  Family History: No family history on file.  Social History:  reports that she has never smoked. She has never used smokeless tobacco. She reports that she does not drink alcohol and does not use drugs.  ROS:                                        Physical Exam: BP 115/73   Pulse 61   Ht 5\' 4"  (1.626 m)   Wt 103 kg   BMI 38.96 kg/m   Constitutional:  Alert and oriented, No acute distress. HEENT: [place] AT, moist mucus membranes.  Trachea midline, no masses.  Laboratory Data: Lab Results  Component Value Date   WBC 10.0 01/27/2020   HGB 11.0 (L) 01/27/2020   HCT 31.3 (L) 01/27/2020   MCV 90.7 01/27/2020   PLT 156 01/27/2020    Lab Results  Component Value Date   CREATININE 0.63 05/17/2019    No results found for: PSA  No results found for: TESTOSTERONE  No results found for: HGBA1C  Urinalysis    Component Value Date/Time   COLORURINE YELLOW (A) 03/15/2018 1658   APPEARANCEUR Clear 10/29/2020 1503   LABSPEC 1.027 03/15/2018 1658   LABSPEC 1.026 03/09/2013 1939   PHURINE 6.0 03/15/2018 1658   GLUCOSEU Negative 10/29/2020 1503   GLUCOSEU Negative 03/09/2013 1939   HGBUR NEGATIVE 03/15/2018 1658   BILIRUBINUR Negative 10/29/2020 1503   BILIRUBINUR Negative 03/09/2013 1939   KETONESUR 20 (A) 03/15/2018 1658   PROTEINUR Negative 10/29/2020 1503   PROTEINUR 30 (A) 03/15/2018 1658   UROBILINOGEN 0.2 07/15/2019 0819   NITRITE Negative 10/29/2020 1503   NITRITE NEGATIVE 03/15/2018 1658   LEUKOCYTESUR Negative 10/29/2020 1503   LEUKOCYTESUR Negative 03/09/2013 1939    Pertinent Imaging:   Assessment & Plan: Reassess in 6 months on daily suppression therapy  There are no diagnoses linked to this encounter.  No follow-ups on  file.  05/07/2013, MD  [facility] [address], [address] [place], [place] [phone]

## 2020-12-12 LAB — CULTURE, URINE COMPREHENSIVE
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
## 2021-04-29 ENCOUNTER — Encounter: Payer: Self-pay | Admitting: Urology

## 2021-04-29 ENCOUNTER — Other Ambulatory Visit: Payer: Self-pay

## 2021-04-29 ENCOUNTER — Ambulatory Visit (INDEPENDENT_AMBULATORY_CARE_PROVIDER_SITE_OTHER): Payer: Medicaid Other | Admitting: Urology

## 2021-04-29 VITALS — BP 108/68 | HR 61 | Ht 64.0 in | Wt 227.0 lb

## 2021-04-29 DIAGNOSIS — N390 Urinary tract infection, site not specified: Secondary | ICD-10-CM | POA: Diagnosis not present

## 2021-04-29 LAB — URINALYSIS, COMPLETE
Bilirubin, UA: NEGATIVE
Glucose, UA: NEGATIVE
Ketones, UA: NEGATIVE
Leukocytes,UA: NEGATIVE
Nitrite, UA: NEGATIVE
Protein,UA: NEGATIVE
RBC, UA: NEGATIVE
Specific Gravity, UA: 1.015 (ref 1.005–1.030)
Urobilinogen, Ur: 0.2 mg/dL (ref 0.2–1.0)
pH, UA: 6 (ref 5.0–7.5)

## 2021-04-29 LAB — MICROSCOPIC EXAMINATION: Bacteria, UA: NONE SEEN

## 2021-04-29 MED ORDER — TRIMETHOPRIM 100 MG PO TABS: 100.0000 mg | ORAL_TABLET | Freq: Every day | ORAL | 11 refills | Status: DC

## 2021-04-29 NOTE — Progress Notes (Signed)
04/29/2021 11:08 AM   Abigail Mejia [birth year] [id number]  Referring provider: Evelene Croon, MD [address] [place],  [place] [address]  Chief Complaint  Patient presents with   Urinary Tract Infection    HPI: I was consulted to assist with recurrent bladder infections.  She gets burning and urgency that respond temporarily antibiotics.  She thinks he still infected and just finished 2 courses of an antibiotic.  She thinks he gets 6 infections a year  At baseline she voids every 3 hours and sometimes gets up once at night.  Recently she started having little urge incontinence wearing a liner but otherwise was continent prior to this.     No prolapse or stress incontinence or urethral diverticulum   Return on trimethoprim suppression therapy in 6 to 7 weeks.  I am suspect that her urgency incontinence may improve on suppression therapy.   Last culture low colony count positive.  Ultrasound normal Clinically not infected.  Urine sent for culture.    Today Frequency stable.  Patient was on trimethoprim.  Last culture negative.  Patient describes stopping trimethoprim due to a pharmacy issue in 2 weeks ago treated for urgency that got better with an antibiotic.  Now she has a vague right-sided back discomfort and she is having a little bit of urgency.   PMH: Past Medical History:  Diagnosis Date   Chronic low back pain without sciatica 12/21/2015   Medical history non-contributory    Supervision of high risk pregnancy, antepartum 06/07/2019   [facility] Prenatal Labs Dating  LMP = 8wk Blood type: O/Positive/-- (04/16 1452)  Genetic Screen NIPS: Normal xy Antibody:Negative (04/16 1452) Anatomic [place] complete Rubella: 1.86 (04/16 1452)  Varicella: immune GTT Early:       77         Third trimester: 94 RPR: Non Reactive (09/03 0939)  Rhogam Not needed HBsAg: Negative (04/16 1452)  Vaccines TDAP: 11/18/19                     Flu Shot: HI    Surgical History: Past Surgical  History:  Procedure Laterality Date   ETHMOIDECTOMY Bilateral 05/13/2018   Procedure: ETHMOIDECTOMY;  Surgeon: Vernie Murders, MD;  Location: [facility];  Service: ENT;  Laterality: Bilateral;   IMAGE GUIDED SINUS SURGERY Bilateral 05/13/2018   Procedure: IMAGE GUIDED SINUS SURGERY WITH PROPEL IMPLANT;  Surgeon: Vernie Murders, MD;  Location: [facility];  Service: ENT;  Laterality: Bilateral;  stryker disk needed PROPEL IMPLANT PLACEMENT GAVE DISK TO CECE 2-28 KP   MAXILLARY ANTROSTOMY Bilateral 05/13/2018   Procedure: MAXILLARY ANTROSTOMY removal of tissue;  Surgeon: Vernie Murders, MD;  Location: [facility];  Service: ENT;  Laterality: Bilateral;   NASAL SINUS SURGERY Bilateral 05/13/2018   Procedure: FRONTAL SINUSOTOMY;  Surgeon: Vernie Murders, MD;  Location: [facility];  Service: ENT;  Laterality: Bilateral;   nexplanon insertion  2012;10/21/2013;   SPHENOIDECTOMY Bilateral 05/13/2018   Procedure: SPHENOIDECTOMY with tissue removal;  Surgeon: Vernie Murders, MD;  Location: [facility];  Service: ENT;  Laterality: Bilateral;   TOOTH EXTRACTION     TUBAL LIGATION Bilateral 01/27/2020   Procedure: POST PARTUM TUBAL LIGATION;  Surgeon: Natale Milch, MD;  Location: [facility] ORS;  Service: Gynecology;  Laterality: Bilateral;   VAGINAL DELIVERY  01/26/2020    Home Medications:  Allergies as of 04/29/2021   Not on File      Medication List  Accurate as of April 29, 2021 11:08 AM. If you have any questions, ask your nurse or doctor.          cetirizine 10 MG tablet Commonly known as: ZYRTEC Take 10 mg by mouth daily.   fluticasone 50 MCG/ACT nasal spray Commonly known as: FLONASE Place 2 sprays into both nostrils daily.   ibuprofen 600 MG tablet Commonly known as: ADVIL Take 1 tablet (600 mg total) by mouth every 6 (six) hours.   meloxicam 7.5 MG tablet Commonly known as: MOBIC Take 7.5 mg by mouth daily.   montelukast 10  MG tablet Commonly known as: SINGULAIR Take 10 mg by mouth daily.   Prenatal Vitamins 28-0.8 MG Tabs Take 1 tablet by mouth daily.   ProAir HFA 108 (90 Base) MCG/ACT inhaler Generic drug: albuterol Place 2 puffs into the nose every 6 (six) hours as needed for wheezing or shortness of breath.   trimethoprim 100 MG tablet Commonly known as: TRIMPEX Take 1 tablet (100 mg total) by mouth daily.        Allergies: Not on File  Family History: No family history on file.  Social History:  reports that she has never smoked. She has never used smokeless tobacco. She reports that she does not drink alcohol and does not use drugs.  ROS:                                        Physical Exam: BP 108/68    Pulse 61    Ht 5\' 4"  (1.626 m)    Wt 103 kg    BMI 38.96 kg/m   Constitutional:  Alert and oriented, No acute distress. HEENT: [place] AT, moist mucus membranes.  Trachea midline, no masses. Cardiovascular: No clubbing, cyanosis, or edema.   Laboratory Data: Lab Results  Component Value Date   WBC 10.0 01/27/2020   HGB 11.0 (L) 01/27/2020   HCT 31.3 (L) 01/27/2020   MCV 90.7 01/27/2020   PLT 156 01/27/2020    Lab Results  Component Value Date   CREATININE 0.63 05/17/2019    No results found for: PSA  No results found for: TESTOSTERONE  No results found for: HGBA1C  Urinalysis    Component Value Date/Time   COLORURINE YELLOW (A) 03/15/2018 1658   APPEARANCEUR Clear 12/10/2020 1142   LABSPEC 1.027 03/15/2018 1658   LABSPEC 1.026 03/09/2013 1939   PHURINE 6.0 03/15/2018 1658   GLUCOSEU Negative 12/10/2020 1142   GLUCOSEU Negative 03/09/2013 1939   HGBUR NEGATIVE 03/15/2018 1658   BILIRUBINUR Negative 12/10/2020 1142   BILIRUBINUR Negative 03/09/2013 1939   KETONESUR 20 (A) 03/15/2018 1658   PROTEINUR Negative 12/10/2020 1142   PROTEINUR 30 (A) 03/15/2018 1658   UROBILINOGEN 0.2 07/15/2019 0819   NITRITE Negative 12/10/2020 1142   NITRITE  NEGATIVE 03/15/2018 1658   LEUKOCYTESUR Negative 12/10/2020 1142   LEUKOCYTESUR Negative 03/09/2013 1939    Pertinent Imaging:   Assessment & Plan: I would recommend the patient go back on suppression therapy.  Call if culture differs.  Urine looked normal.  Trimethoprim 100 mg 30x11 sent to pharmacy  1. Acute UTI  - Urinalysis, Complete   No follow-ups on file.  05/07/2013, MD  [facility] [address] [place], [place] [phone]

## 2021-05-02 LAB — CULTURE, URINE COMPREHENSIVE

## 2021-05-07 ENCOUNTER — Other Ambulatory Visit (HOSPITAL_COMMUNITY)
Admission: RE | Admit: 2021-05-07 | Discharge: 2021-05-07 | Disposition: A | Discharge status: Home / Self Care | Payer: Medicaid Other | Source: Ambulatory Visit | Attending: Family Medicine | Admitting: Family Medicine

## 2021-05-07 ENCOUNTER — Ambulatory Visit (INDEPENDENT_AMBULATORY_CARE_PROVIDER_SITE_OTHER): Payer: Medicaid Other | Admitting: Family Medicine

## 2021-05-07 ENCOUNTER — Other Ambulatory Visit: Payer: Self-pay

## 2021-05-07 ENCOUNTER — Encounter: Payer: Self-pay | Admitting: Family Medicine

## 2021-05-07 VITALS — BP 122/74 | Ht 64.0 in | Wt 232.0 lb

## 2021-05-07 DIAGNOSIS — Z9851 Tubal ligation status: Secondary | ICD-10-CM | POA: Insufficient documentation

## 2021-05-07 DIAGNOSIS — Z113 Encounter for screening for infections with a predominantly sexual mode of transmission: Secondary | ICD-10-CM | POA: Insufficient documentation

## 2021-05-07 DIAGNOSIS — Z6841 Body Mass Index (BMI) 40.0 and over, adult: Secondary | ICD-10-CM | POA: Diagnosis not present

## 2021-05-07 DIAGNOSIS — Z01419 Encounter for gynecological examination (general) (routine) without abnormal findings: Secondary | ICD-10-CM
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
## 2021-05-08 LAB — CERVICOVAGINAL ANCILLARY ONLY
Bacterial Vaginitis (gardnerella): NEGATIVE
Candida Glabrata: NEGATIVE
Candida Vaginitis: NEGATIVE
Chlamydia: NEGATIVE
Comment: NEGATIVE
Comment: NEGATIVE
Comment: NEGATIVE
Comment: NEGATIVE
Comment: NEGATIVE
Comment: NORMAL
Neisseria Gonorrhea: NEGATIVE
Trichomonas: NEGATIVE

## 2021-05-08 LAB — HEMOGLOBIN A1C
Est. average glucose Bld gHb Est-mCnc: 97 mg/dL
Hgb A1c MFr Bld: 5 % (ref 4.8–5.6)

## 2021-05-27 ENCOUNTER — Other Ambulatory Visit: Payer: Self-pay

## 2021-05-27 ENCOUNTER — Emergency Department
Admission: EM | Admit: 2021-05-27 | Discharge: 2021-05-27 | Disposition: A | Discharge status: Home / Self Care | Payer: Medicaid Other | Attending: Emergency Medicine | Admitting: Emergency Medicine

## 2021-05-27 ENCOUNTER — Encounter: Payer: Self-pay | Admitting: Emergency Medicine

## 2021-05-27 DIAGNOSIS — Y92410 Unspecified street and highway as the place of occurrence of the external cause: Secondary | ICD-10-CM | POA: Insufficient documentation

## 2021-05-27 DIAGNOSIS — S3992XA Unspecified injury of lower back, initial encounter: Secondary | ICD-10-CM | POA: Insufficient documentation

## 2021-05-27 DIAGNOSIS — M545 Low back pain, unspecified: Secondary | ICD-10-CM

## 2021-05-27 MED ORDER — IBUPROFEN 800 MG PO TABS: 800.0000 mg | ORAL_TABLET | Freq: Three times a day (TID) | ORAL | 0 refills | Status: DC | PRN

## 2021-05-27 MED ORDER — LIDOCAINE 5 % EX PTCH: 1.0000 | MEDICATED_PATCH | Freq: Two times a day (BID) | CUTANEOUS | 0 refills | Status: AC | PRN
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
## 2021-05-27 NOTE — ED Notes (Signed)
Pt NAD, a/ox4. Pt verbalizes understanding of all DC and f/u instructions. All questions answered. Pt walks with steady gait to lobby at DC.  ? ?
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
## 2021-06-10 ENCOUNTER — Ambulatory Visit: Payer: Medicaid Other | Admitting: Urology

## 2021-06-28 ENCOUNTER — Telehealth: Payer: Self-pay | Admitting: Family Medicine

## 2021-06-28 ENCOUNTER — Other Ambulatory Visit: Payer: Medicaid Other

## 2021-06-28 NOTE — Telephone Encounter (Signed)
Contacted the pt to schedule lab work per [facility].  Had to leave a message.  Asked pt to call back to schedule.   ?

## 2021-07-01 LAB — COMPREHENSIVE METABOLIC PANEL
ALT: 19 IU/L (ref 0–32)
AST: 19 IU/L (ref 0–40)
Albumin/Globulin Ratio: 1.8 (ref 1.2–2.2)
Albumin: 4.3 g/dL (ref 3.8–4.8)
Alkaline Phosphatase: 65 IU/L (ref 44–121)
BUN/Creatinine Ratio: 21 (ref 9–23)
BUN: 15 mg/dL (ref 6–20)
Bilirubin Total: 0.3 mg/dL (ref 0.0–1.2)
CO2: 19 mmol/L — ABNORMAL LOW (ref 20–29)
Calcium: 8.9 mg/dL (ref 8.7–10.2)
Chloride: 107 mmol/L — ABNORMAL HIGH (ref 96–106)
Creatinine, Ser: 0.71 mg/dL (ref 0.57–1.00)
Globulin, Total: 2.4 g/dL (ref 1.5–4.5)
Glucose: 86 mg/dL (ref 70–99)
Potassium: 4.1 mmol/L (ref 3.5–5.2)
Sodium: 143 mmol/L (ref 134–144)
Total Protein: 6.7 g/dL (ref 6.0–8.5)
eGFR: 117 mL/min/{1.73_m2} (ref >59)

## 2021-07-01 LAB — CBC
Hematocrit: 37.1 % (ref 34.0–46.6)
Hemoglobin: 12.8 g/dL (ref 11.1–15.9)
MCH: 31.8 pg (ref 26.6–33.0)
MCHC: 34.5 g/dL (ref 31.5–35.7)
MCV: 92 fL (ref 79–97)
Platelets: 257 10*3/uL (ref 150–450)
RBC: 4.03 x10E6/uL (ref 3.77–5.28)
RDW: 12.1 % (ref 11.7–15.4)
WBC: 4.6 10*3/uL (ref 3.4–10.8)

## 2021-07-01 LAB — VITAMIN D 25 HYDROXY (VIT D DEFICIENCY, FRACTURES): Vit D, 25-Hydroxy: 24.7 ng/mL — ABNORMAL LOW (ref 30.0–100.0)

## 2021-07-01 LAB — TSH: TSH: 2.47 u[IU]/mL (ref 0.450–4.500)

## 2021-07-01 LAB — RPR: RPR Ser Ql: NONREACTIVE

## 2021-07-01 LAB — HSV(HERPES SIMPLEX VRS) I + II AB-IGG
HSV 1 Glycoprotein G Ab, IgG: 27.5 index — ABNORMAL HIGH (ref 0.00–0.90)
HSV 2 IgG, Type Spec: 1.92 index — ABNORMAL HIGH (ref 0.00–0.90)

## 2021-07-01 LAB — HSV-2 IGG SUPPLEMENTAL TEST

## 2021-07-01 LAB — LIPID PANEL
Chol/HDL Ratio: 3.9 ratio (ref 0.0–4.4)
Cholesterol, Total: 205 mg/dL — ABNORMAL HIGH (ref 100–199)
HDL: 52 mg/dL (ref >39)
LDL Chol Calc (NIH): 141 mg/dL — ABNORMAL HIGH (ref 0–99)
Triglycerides: 66 mg/dL (ref 0–149)
VLDL Cholesterol Cal: 12 mg/dL (ref 5–40)

## 2021-07-01 LAB — HIV ANTIBODY (ROUTINE TESTING W REFLEX): HIV Screen 4th Generation wRfx: NONREACTIVE

## 2021-07-03 ENCOUNTER — Encounter: Payer: Self-pay | Admitting: Family Medicine

## 2021-07-03 NOTE — Telephone Encounter (Signed)
Please advise 

## 2021-07-04 NOTE — Telephone Encounter (Signed)
Patient was scheduled for labs 06/28/21

## 2022-03-21 ENCOUNTER — Other Ambulatory Visit: Payer: Self-pay | Admitting: Family

## 2022-03-21 ENCOUNTER — Ambulatory Visit
Admission: RE | Admit: 2022-03-21 | Discharge: 2022-03-21 | Disposition: A | Discharge status: Home / Self Care | Payer: Medicaid Other | Attending: Family | Admitting: Family

## 2022-03-21 ENCOUNTER — Ambulatory Visit
Admission: RE | Admit: 2022-03-21 | Discharge: 2022-03-21 | Disposition: A | Discharge status: Home / Self Care | Payer: Medicaid Other | Source: Ambulatory Visit | Attending: Family | Admitting: Family

## 2022-03-21 DIAGNOSIS — M79644 Pain in right finger(s): Secondary | ICD-10-CM

## 2022-05-05 ENCOUNTER — Ambulatory Visit: Payer: Medicaid Other | Admitting: Urology

## 2022-06-16 ENCOUNTER — Ambulatory Visit (INDEPENDENT_AMBULATORY_CARE_PROVIDER_SITE_OTHER): Payer: Medicaid Other | Admitting: Urology

## 2022-06-16 VITALS — BP 114/77 | HR 62 | Ht 64.0 in | Wt 249.1 lb

## 2022-06-16 DIAGNOSIS — Z87440 Personal history of urinary (tract) infections: Secondary | ICD-10-CM | POA: Diagnosis not present

## 2022-06-16 DIAGNOSIS — N390 Urinary tract infection, site not specified: Secondary | ICD-10-CM

## 2022-06-16 LAB — URINALYSIS, COMPLETE
Bilirubin, UA: NEGATIVE
Glucose, UA: NEGATIVE
Ketones, UA: NEGATIVE
Leukocytes,UA: NEGATIVE
Nitrite, UA: NEGATIVE
Protein,UA: NEGATIVE
RBC, UA: NEGATIVE
Specific Gravity, UA: 1.02 (ref 1.005–1.030)
Urobilinogen, Ur: 1 mg/dL (ref 0.2–1.0)
pH, UA: 7 (ref 5.0–7.5)

## 2022-06-16 LAB — MICROSCOPIC EXAMINATION

## 2022-06-16 MED ORDER — NITROFURANTOIN MACROCRYSTAL 100 MG PO CAPS: 100.0000 mg | ORAL_CAPSULE | Freq: Every day | ORAL | 11 refills | Status: DC

## 2022-06-16 NOTE — Progress Notes (Signed)
06/16/2022 2:15 PM   Abigail Mejia Jan 04, 1991 [id number]  Referring provider: Evelene Croon, MD [address] [place],  [place] [address]  No chief complaint on file.   HPI: I was consulted to assist with recurrent bladder infections.  She gets burning and urgency that respond temporarily antibiotics.  She thinks he still infected and just finished 2 courses of an antibiotic.  She thinks he gets 6 infections a year  At baseline she voids every 3 hours and sometimes gets up once at night.  Recently she started having little urge incontinence wearing a liner but otherwise was continent prior to this.     No prolapse or stress incontinence or urethral diverticulum   Return on trimethoprim suppression therapy in 6 to 7 weeks.  I am suspect that her urgency incontinence may improve on suppression therapy.   Last culture low colony count positive.  Ultrasound normal Clinically not infected.  Urine sent for culture.     Today Frequency stable.  Patient was on trimethoprim.  Last culture negative.  Patient describes stopping trimethoprim due to a pharmacy issue in 2 weeks ago treated for urgency that got better with an antibiotic.  Now she has a vague right-sided back discomfort and she is having a little bit of urgency.    I would recommend the patient go back on suppression therapy. Call if culture differs. Urine looked normal. Trimethoprim 100 mg 30x11 sent to pharmacy   Today Patient is taking daily trimethoprim but she says she has had 5 infections in the last year with cystitis symptoms.  She does not think she is infected today.  Frequency is stable.  Last urine cultures in the medical record were negative.    PMH: Past Medical History:  Diagnosis Date   Chronic low back pain without sciatica 12/21/2015   Medical history non-contributory    Supervision of high risk pregnancy, antepartum 06/07/2019   [facility] Prenatal Labs Dating  LMP = 8wk Blood type: O/Positive/--  (04/16 1452)  Genetic Screen NIPS: Normal xy Antibody:Negative (04/16 1452) Anatomic [place] complete Rubella: 1.86 (04/16 1452)  Varicella: immune GTT Early:       77         Third trimester: 94 RPR: Non Reactive (09/03 0939)  Rhogam Not needed HBsAg: Negative (04/16 1452)  Vaccines TDAP: 11/18/19                     Flu Shot: HI    Surgical History: Past Surgical History:  Procedure Laterality Date   ETHMOIDECTOMY Bilateral 05/13/2018   Procedure: ETHMOIDECTOMY;  Surgeon: Vernie Murders, MD;  Location: [facility];  Service: ENT;  Laterality: Bilateral;   IMAGE GUIDED SINUS SURGERY Bilateral 05/13/2018   Procedure: IMAGE GUIDED SINUS SURGERY WITH PROPEL IMPLANT;  Surgeon: Vernie Murders, MD;  Location: [facility];  Service: ENT;  Laterality: Bilateral;  stryker disk needed PROPEL IMPLANT PLACEMENT GAVE DISK TO CECE 2-28 KP   MAXILLARY ANTROSTOMY Bilateral 05/13/2018   Procedure: MAXILLARY ANTROSTOMY removal of tissue;  Surgeon: Vernie Murders, MD;  Location: [facility];  Service: ENT;  Laterality: Bilateral;   NASAL SINUS SURGERY Bilateral 05/13/2018   Procedure: FRONTAL SINUSOTOMY;  Surgeon: Vernie Murders, MD;  Location: [facility];  Service: ENT;  Laterality: Bilateral;   nexplanon insertion  2012;10/21/2013;   SPHENOIDECTOMY Bilateral 05/13/2018   Procedure: SPHENOIDECTOMY with tissue removal;  Surgeon: Vernie Murders, MD;  Location: [facility];  Service: ENT;  Laterality: Bilateral;  TOOTH EXTRACTION     TUBAL LIGATION Bilateral 01/27/2020   Procedure: POST PARTUM TUBAL LIGATION;  Surgeon: Natale Milch, MD;  Location: [facility] OR[facility];  Service: Gynecology;  Laterality: Bilateral;   VAGINAL DELIVERY  01/26/2020    Home Medications:  Allergies as of 06/16/2022   No Known Allergies      Medication List        Accurate as of June 16, 2022  2:15 PM. If you have any questions, ask your nurse or doctor.          cetirizine 10 MG tablet Commonly  known as: ZYRTEC Take 10 mg by mouth daily.   fluticasone 50 MCG/ACT nasal spray Commonly known as: FLONASE Place 2 sprays into both nostrils daily.   ibuprofen 800 MG tablet Commonly known as: ADVIL Take 1 tablet (800 mg total) by mouth every 8 (eight) hours as needed.   meloxicam 7.5 MG tablet Commonly known as: MOBIC Take 7.5 mg by mouth daily.   montelukast 10 MG tablet Commonly known as: SINGULAIR Take 10 mg by mouth daily.   Prenatal Vitamins 28-0.8 MG Tabs Take 1 tablet by mouth daily.   ProAir HFA 108 (90 Base) MCG/ACT inhaler Generic drug: albuterol Place 2 puffs into the nose every 6 (six) hours as needed for wheezing or shortness of breath.        Allergies: No Known Allergies  Family History: No family history on file.  Social History:  reports that she has never smoked. She has never used smokeless tobacco. She reports that she does not drink alcohol and does not use drugs.  ROS:                                        Physical Exam: There were no vitals taken for this visit.  Constitutional:  Alert and oriented, No acute distress. HEENT: [place] AT, moist mucus membranes.  Trachea midline, no masses.   Laboratory Data: Lab Results  Component Value Date   WBC 4.6 06/28/2021   HGB 12.8 06/28/2021   HCT 37.1 06/28/2021   MCV 92 06/28/2021   PLT 257 06/28/2021    Lab Results  Component Value Date   CREATININE 0.71 06/28/2021    No results found for: "PSA"  No results found for: "TESTOSTERONE"  Lab Results  Component Value Date   HGBA1C 5.0 05/07/2021    Urinalysis    Component Value Date/Time   COLORURINE YELLOW (A) 03/15/2018 1658   APPEARANCEUR Hazy (A) 04/29/2021 1102   LABSPEC 1.027 03/15/2018 1658   LABSPEC 1.026 03/09/2013 1939   PHURINE 6.0 03/15/2018 1658   GLUCOSEU Negative 04/29/2021 1102   GLUCOSEU Negative 03/09/2013 1939   HGBUR NEGATIVE 03/15/2018 1658   BILIRUBINUR Negative 04/29/2021 1102    BILIRUBINUR Negative 03/09/2013 1939   KETONESUR 20 (A) 03/15/2018 1658   PROTEINUR Negative 04/29/2021 1102   PROTEINUR 30 (A) 03/15/2018 1658   UROBILINOGEN 0.2 07/15/2019 0819   NITRITE Negative 04/29/2021 1102   NITRITE NEGATIVE 03/15/2018 1658   LEUKOCYTESUR Negative 04/29/2021 1102   LEUKOCYTESUR Negative 03/09/2013 1939    Pertinent Imaging:   Assessment & Plan: Urine reviewed and sent for culture.  I thought it was best to switch the patient to daily Macrodantin 100 mg daily 30 x 11 and reassess efficacy in 3 months.  Call if culture positive Patient understands importance of getting urine cultures for any  breakthrough symptoms  There are no diagnoses linked to this encounter.  No follow-ups on file.  Martina Sinner, MD  [facility] [address] [place], [place] [address] [phone]

## 2022-06-19 LAB — CULTURE, URINE COMPREHENSIVE

## 2022-09-15 ENCOUNTER — Encounter: Payer: Self-pay | Admitting: Urology

## 2022-09-15 ENCOUNTER — Ambulatory Visit: Payer: Medicaid Other | Admitting: Urology

## 2022-10-12 ENCOUNTER — Ambulatory Visit
Admission: EM | Admit: 2022-10-12 | Discharge: 2022-10-12 | Disposition: A | Discharge status: Home / Self Care | Payer: Medicaid Other | Attending: Emergency Medicine | Admitting: Emergency Medicine

## 2022-10-12 DIAGNOSIS — L234 Allergic contact dermatitis due to dyes: Secondary | ICD-10-CM

## 2022-10-12 MED ORDER — PREDNISONE 10 MG (21) PO TBPK: ORAL_TABLET | Freq: Every day | ORAL | 0 refills | Status: DC

## 2022-10-12 MED ORDER — CETIRIZINE HCL 10 MG PO TABS: 10.0000 mg | ORAL_TABLET | Freq: Every day | ORAL | 0 refills | Status: AC

## 2022-10-12 NOTE — ED Triage Notes (Signed)
Patient presents to UC for rash x 1 day. States she dyed her hair and developed a rash on her neck. Has applied calamine lotion and took benadryl.

## 2022-10-12 NOTE — ED Provider Notes (Signed)
[name]    CSN: [id number] Arrival date & time: 10/12/22  1508      History   Chief Complaint Chief Complaint  Patient presents with   Rash    HPI Abigail Mejia is a 32 y.o. female.  Patient presents with pruritic rash on her neck, lower jaw, and both arms x 1 day.  The rash developed after she dyed her hair.  She has been treating this with calamine lotion and Benadryl.  No difficulty swallowing or breathing.  No fever or other symptoms.  The history is provided by the patient and medical records.    Past Medical History:  Diagnosis Date   Chronic low back pain without sciatica 12/21/2015   Medical history non-contributory    Supervision of high risk pregnancy, antepartum 06/07/2019   [facility]inic [facility] Prenatal Labs Dating  LMP = 8wk Blood type: O/Positive/-- (04/16 1452)  Genetic Screen NIPS: Normal xy Antibody:Negative (04/16 1452) Anatomic [place] complete Rubella: 1.86 (04/16 1452)  Varicella: immune GTT Early:       77         Third trimester: 94 RPR: Non Reactive (09/03 0939)  Rhogam Not needed HBsAg: Negative (04/16 1452)  Vaccines TDAP: 11/18/19                     Flu Shot: HI    Patient Active Problem List   Diagnosis Date Noted   S/P tubal ligation 05/07/2021   Encounter for female sterilization procedure    BMI 40.0-44.9, adult (HCC) 06/07/2019    Past Surgical History:  Procedure Laterality Date   ETHMOIDECTOMY Bilateral 05/13/2018   Procedure: ETHMOIDECTOMY;  Surgeon: Vernie Murders, MD;  Location: [facility];  Service: ENT;  Laterality: Bilateral;   IMAGE GUIDED SINUS SURGERY Bilateral 05/13/2018   Procedure: IMAGE GUIDED SINUS SURGERY WITH PROPEL IMPLANT;  Surgeon: Vernie Murders, MD;  Location: [facility];  Service: ENT;  Laterality: Bilateral;  stryker disk needed PROPEL IMPLANT PLACEMENT GAVE DISK TO CECE 2-28 KP   MAXILLARY ANTROSTOMY Bilateral 05/13/2018   Procedure: MAXILLARY ANTROSTOMY removal of tissue;  Surgeon: Vernie Murders,  MD;  Location: [facility];  Service: ENT;  Laterality: Bilateral;   NASAL SINUS SURGERY Bilateral 05/13/2018   Procedure: FRONTAL SINUSOTOMY;  Surgeon: Vernie Murders, MD;  Location: [facility];  Service: ENT;  Laterality: Bilateral;   nexplanon insertion  2012;10/21/2013;   SPHENOIDECTOMY Bilateral 05/13/2018   Procedure: SPHENOIDECTOMY with tissue removal;  Surgeon: Vernie Murders, MD;  Location: [facility];  Service: ENT;  Laterality: Bilateral;   TOOTH EXTRACTION     TUBAL LIGATION Bilateral 01/27/2020   Procedure: POST PARTUM TUBAL LIGATION;  Surgeon: Natale Milch, MD;  Location: [facility] OR[facility];  Service: Gynecology;  Laterality: Bilateral;   VAGINAL DELIVERY  01/26/2020    OB History     Gravida  3   Para  3   Term  3   Preterm      AB      Living  3      SAB      IAB      Ectopic      Multiple  0   Live Births  3            Home Medications    Prior to Admission medications   Medication Sig Start Date End Date Taking? Authorizing Provider  cetirizine (ZYRTEC ALLERGY) 10 MG tablet Take 1 tablet (10 mg total) by mouth  daily. 10/12/22  Yes Mickie Bail, NP  predniSONE (STERAPRED UNI-PAK 21 TAB) 10 MG (21) TBPK tablet Take by mouth daily. As directed 10/12/22  Yes Mickie Bail, NP  fluticasone (FLONASE) 50 MCG/ACT nasal spray Place 2 sprays into both nostrils daily. 07/24/19   [provider]  ibuprofen (ADVIL) 800 MG tablet Take 1 tablet (800 mg total) by mouth every 8 (eight) hours as needed. 05/27/21   Menshew, Charlesetta Ivory, PA-C  meloxicam (MOBIC) 7.5 MG tablet Take 7.5 mg by mouth daily. 10/28/20   [provider]  montelukast (SINGULAIR) 10 MG tablet Take 10 mg by mouth daily. 11/18/18   [provider]  nitrofurantoin (MACRODANTIN) 100 MG capsule Take 1 capsule (100 mg total) by mouth daily. 06/16/22   Alfredo Martinez, MD  PROAIR HFA 108 [phone] Base) MCG/ACT inhaler Place 2 puffs into the nose every 6  (six) hours as needed for wheezing or shortness of breath.  06/12/17   [provider]  trimethoprim (TRIMPEX) 100 MG tablet Take 100 mg by mouth daily.    [provider]    Family History History reviewed. No pertinent family history.  Social History Social History   Tobacco Use   Smoking status: Never   Smokeless tobacco: Never  Vaping Use   Vaping status: Never Used  Substance Use Topics   Alcohol use: No   Drug use: No     Allergies   Patient has no known allergies.   Review of Systems Review of Systems  Constitutional:  Negative for chills and fever.  HENT:  Negative for sore throat, trouble swallowing and voice change.   Respiratory:  Negative for cough and shortness of breath.   Cardiovascular:  Negative for chest pain and palpitations.  Skin:  Positive for rash. Negative for color change.     Physical Exam Triage Vital Signs ED Triage Vitals  Encounter Vitals Group     BP      Systolic BP Percentile      Diastolic BP Percentile      Pulse      Resp      Temp      Temp src      SpO2      Weight      Height      Head Circumference      Peak Flow      Pain Score      Pain Loc      Pain Education      Exclude from Growth Chart    No data found.  Updated Vital Signs BP 99/65 (BP Location: Left Arm)   Pulse 96   Temp 97.7 F (36.5 C)   Resp 18   LMP 10/06/2022   SpO2 97%   Breastfeeding Yes   Visual Acuity Right Eye Distance:   Left Eye Distance:   Bilateral Distance:    Right Eye Near:   Left Eye Near:    Bilateral Near:     Physical Exam Vitals and nursing note reviewed.  Constitutional:      General: She is not in acute distress.    Appearance: She is well-developed.  HENT:     Mouth/Throat:     Mouth: Mucous membranes are moist.     Pharynx: Oropharynx is clear.  Cardiovascular:     Rate and Rhythm: Normal rate and regular rhythm.     Heart sounds: Normal heart sounds.  Pulmonary:     Effort: Pulmonary  effort is normal. No respiratory distress.     Breath sounds: Normal breath sounds.  Musculoskeletal:     Cervical back: Neck supple.  Skin:    General: Skin is warm and dry.     Findings: Rash present.     Comments: Maculopapular rash on neck, jawline, right wrist, right upper arm, left upper arm.  No open wounds or drainage.  Neurological:     Mental Status: She is alert.      UC Treatments / Results  Labs (all labs ordered are listed, but only abnormal results are displayed) Labs Reviewed - No data to display  EKG   Radiology No results found.  Procedures Procedures (including critical care time)  Medications Ordered in UC Medications - No data to display  Initial Impression / Assessment and Plan / UC Course  I have reviewed the triage vital signs and the nursing notes.  Pertinent labs & imaging results that were available during my care of the patient were reviewed by me and considered in my medical decision making (see chart for details).    Contact dermatitis due to hair dye, Lactating mother.  Patient is breast-feeding.  Treating with prednisone and Zyrtec.  Education provided on contact dermatitis.  Instructed patient to follow-up with her PCP.  ED precautions given.  She agrees to plan of care.  Final Clinical Impressions(s) / UC Diagnoses   Final diagnoses:  Allergic contact dermatitis due to dyes  Lactating mother     Discharge Instructions      Take prednisone and Zyrtec as directed.    Follow up with your primary care provider.   Go to the emergency department if you have difficulty swallowing or breathing.        ED Prescriptions     Medication Sig Dispense Auth. Provider   predniSONE (STERAPRED UNI-PAK 21 TAB) 10 MG (21) TBPK tablet Take by mouth daily. As directed 21 tablet Mickie Bail, NP   cetirizine (ZYRTEC ALLERGY) 10 MG tablet Take 1 tablet (10 mg total) by mouth daily. 14 tablet Mickie Bail, NP      PDMP not reviewed this  encounter.   Mickie Bail, NP 10/12/22 1535

## 2022-10-12 NOTE — Discharge Instructions (Addendum)
 Take prednisone and Zyrtec as directed.    Follow up with your primary care provider.   Go to the emergency department if you have difficulty swallowing or breathing.

## 2023-01-01 IMAGING — US US RENAL
1 series · 14 of 25 positions shown · non-contrast
Comparison: None.

CLINICAL DATA: UTI

EXAM:
RENAL / URINARY TRACT ULTRASOUND COMPLETE

[Series 1: us renal · 0.28mm/px · 45 acquisitions, 14 frames shown]
[im 1/45]
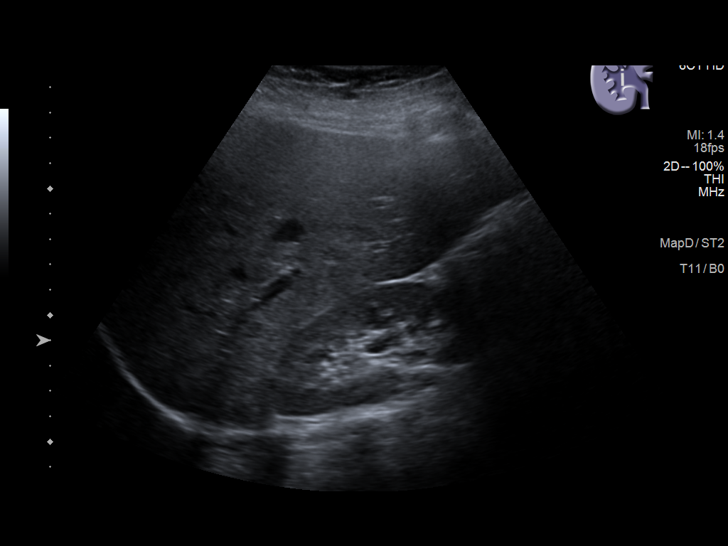
[im 4/45]
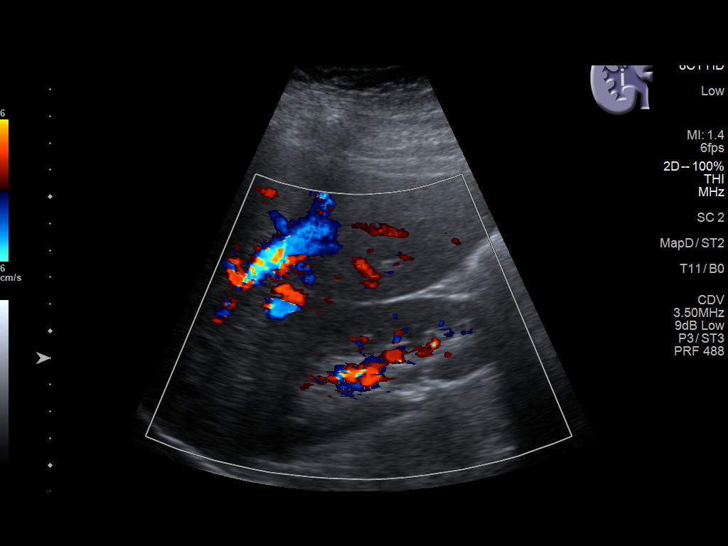
[im 8/45]
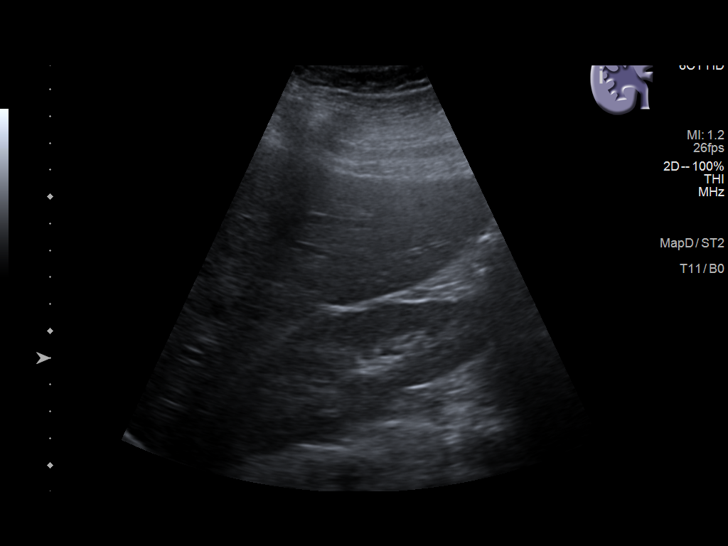
[im 12/45]
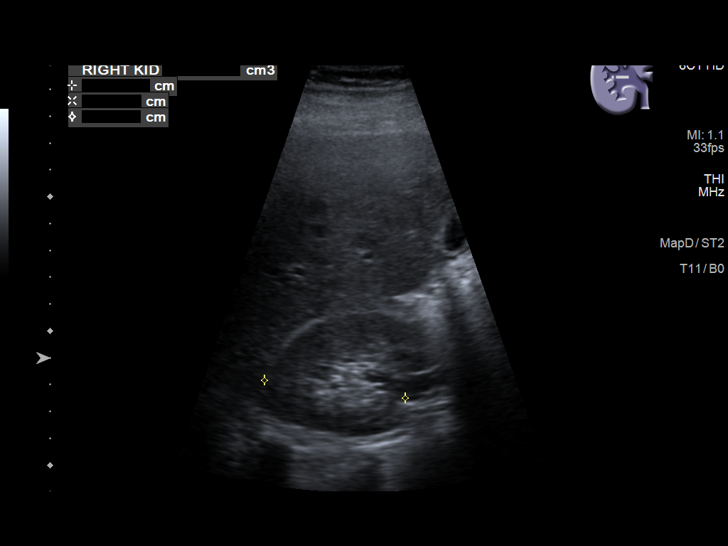
[im 15/45]
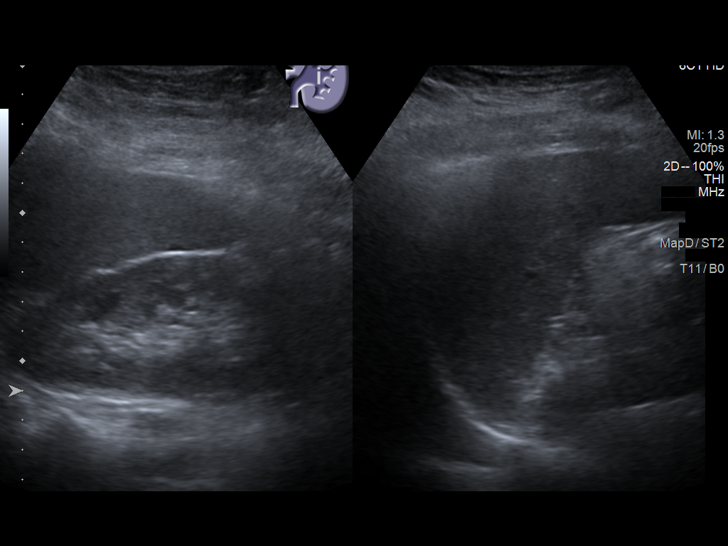
[im 17/45]
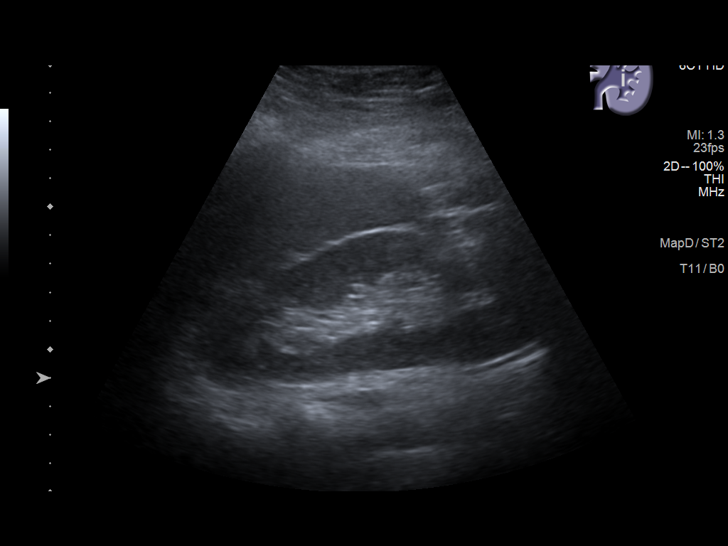
[im 21/45]
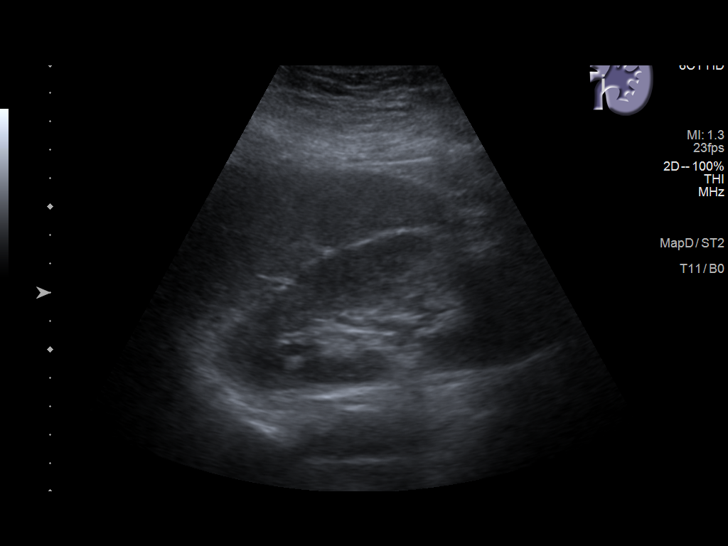
[im 24/45]
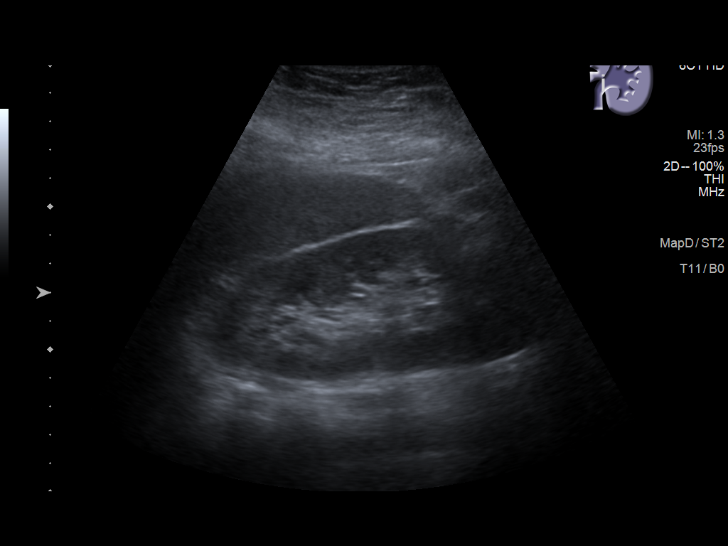
[im 28/45]
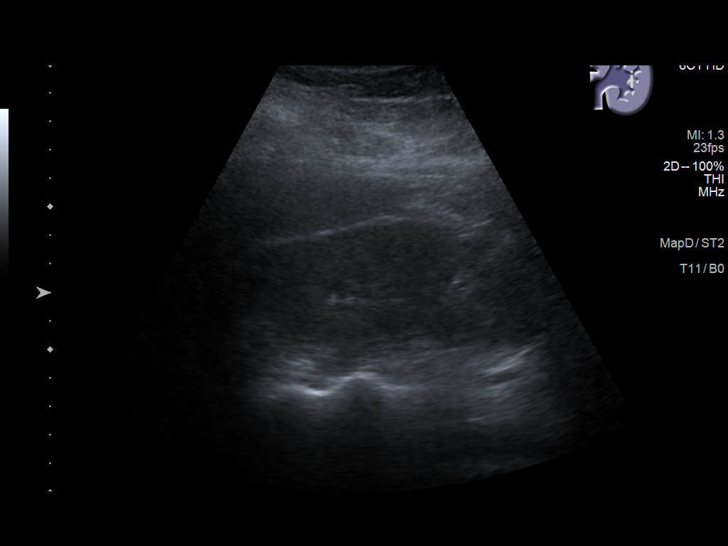
[im 30/45]
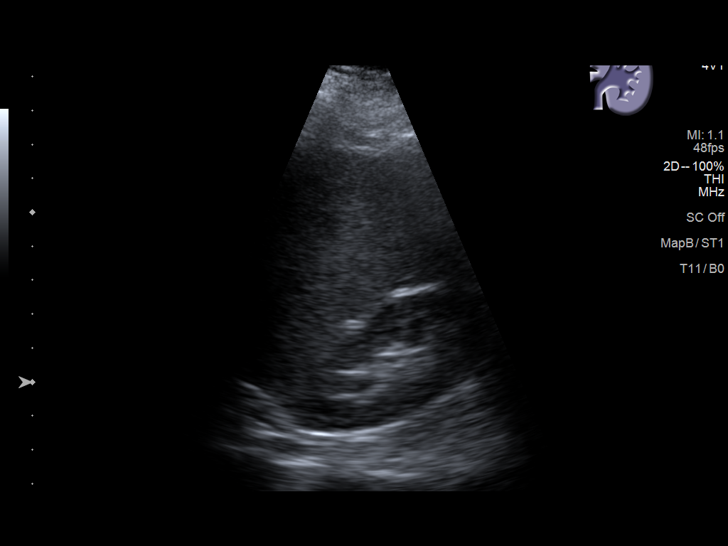
[im 34/45]
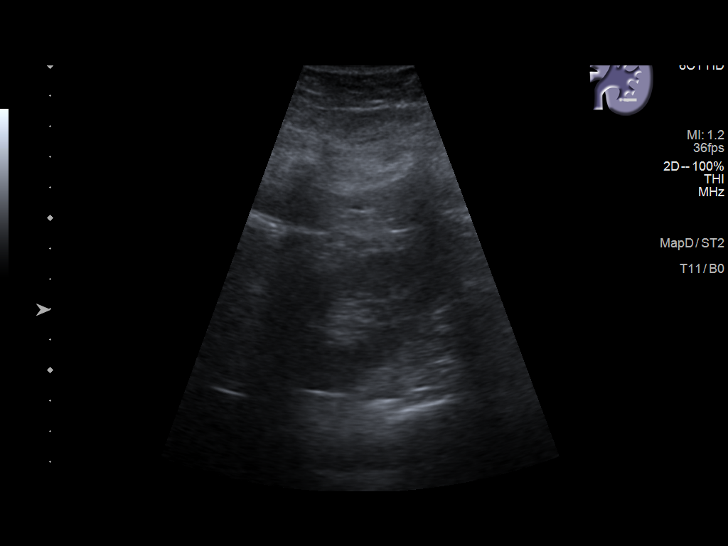
[im 37/45]
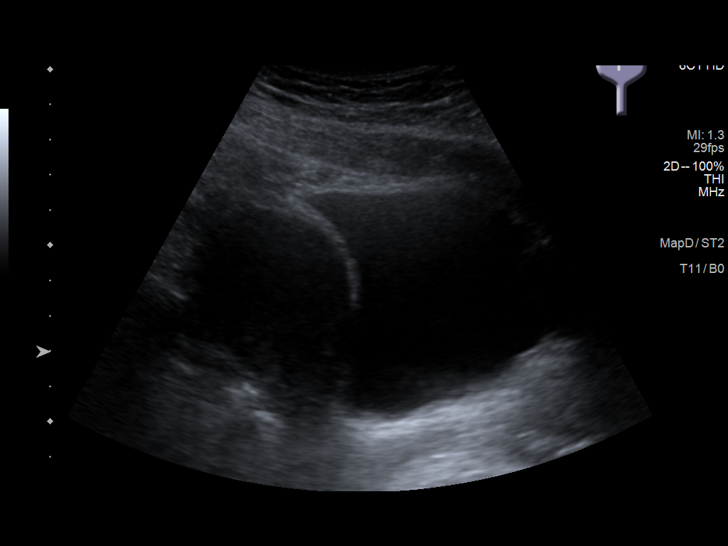
[im 41/45]
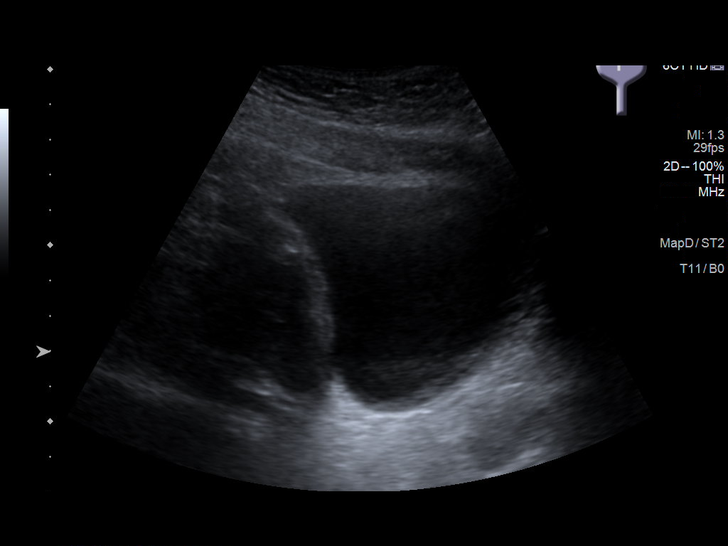
[im 45/45]
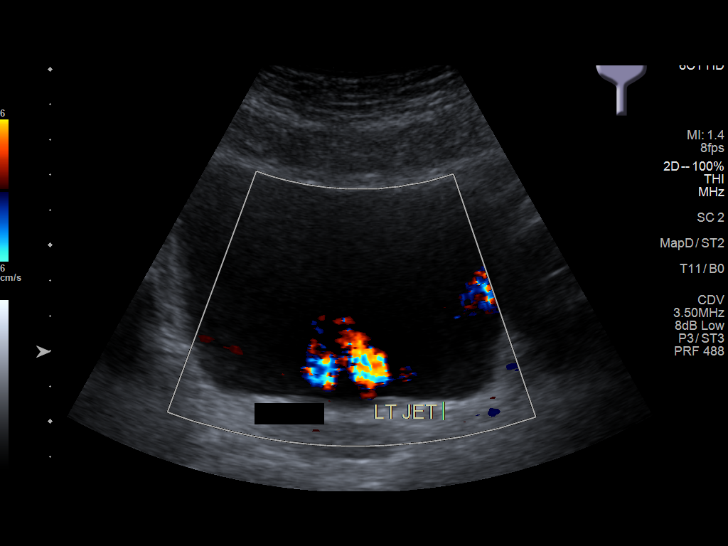

[14 of 25 positions shown; findings below may reference images not displayed]

FINDINGS: Right Kidney:

Renal measurements: 12.1 x 4.2 x 5.3 cm = volume: 141 mL.
Echogenicity within normal limits. No mass or hydronephrosis
visualized.

Left Kidney:

Renal measurements: 12.6 x 5.0 x 5.5 cm = volume: 181 mL.
Echogenicity within normal limits. No mass or hydronephrosis
visualized.

Bladder:

Mobile debris in the urinary bladder. Bilateral ureteral jets are
visualized.

Other:

None.
IMPRESSION: 1. No hydronephrosis or other ultrasound abnormality of the kidneys.
2. Mobile debris in the urinary bladder. Correlate with urinalysis
per stated history of UTI.

## 2023-01-09 ENCOUNTER — Ambulatory Visit (INDEPENDENT_AMBULATORY_CARE_PROVIDER_SITE_OTHER): Payer: Medicaid Other | Admitting: Plastic Surgery

## 2023-01-09 ENCOUNTER — Encounter: Payer: Self-pay | Admitting: Plastic Surgery

## 2023-01-09 VITALS — BP 114/74 | HR 70 | Ht 64.0 in | Wt 234.4 lb

## 2023-01-09 DIAGNOSIS — M546 Pain in thoracic spine: Secondary | ICD-10-CM

## 2023-01-09 DIAGNOSIS — M545 Low back pain, unspecified: Secondary | ICD-10-CM

## 2023-01-09 DIAGNOSIS — M542 Cervicalgia: Secondary | ICD-10-CM | POA: Insufficient documentation

## 2023-01-09 DIAGNOSIS — G8929 Other chronic pain: Secondary | ICD-10-CM

## 2023-01-09 DIAGNOSIS — N62 Hypertrophy of breast: Secondary | ICD-10-CM | POA: Insufficient documentation

## 2023-01-09 DIAGNOSIS — M549 Dorsalgia, unspecified: Secondary | ICD-10-CM | POA: Insufficient documentation

## 2023-01-09 DIAGNOSIS — Z6841 Body Mass Index (BMI) 40.0 and over, adult: Secondary | ICD-10-CM

## 2023-01-09 NOTE — Progress Notes (Signed)
Patient ID: Abigail Mejia, female    DOB: 05-13-1990, 32 y.o.   MRN: [id number]   Chief Complaint  Patient presents with   Advice Only   Breast Problem    Mammary Hyperplasia: The patient is a 32 y.o. female with a history of mammary hyperplasia for several years.  She has extremely large breasts causing symptoms that include the following: Back pain in the upper and lower back, including neck pain. She pulls or pins her bra straps to provide better lift and relief of the pressure and pain. She notices relief by holding her breast up manually.  Her shoulder straps cause grooves and pain and pressure that requires padding for relief. Pain medication is sometimes required with motrin and tylenol.  Activities that are hindered by enlarged breasts include: exercise and running.  She has tried supportive clothing as well as fitted bras without improvement.  Her breasts are extremely large and fairly symmetric.  She has hyperpigmentation of the inframammary area on both sides.  The sternal to nipple distance on the right is 35 cm and the left is 35 cm.  The IMF distance is 19 cm.  She is 5 feet 4 inches tall and weighs 234 pounds.  The BMI = 40.2 kg/m.  Preoperative bra size = DD/DDD cup. Ok with a B/C cup. The estimated excess breast tissue to be removed at the time of surgery = 700 grams on the left and 700 grams on the right.  Mammogram history: none.  Family history of breast cancer:  none.  Tobacco use:  none.   The patient expresses the desire to pursue surgical intervention.  Past surgical history includes sinus surgery and tubal ligation.    Review of Systems  Constitutional: Negative.   HENT: Negative.    Eyes: Negative.   Respiratory: Negative.    Cardiovascular: Negative.   Gastrointestinal: Negative.   Endocrine: Negative.   Genitourinary: Negative.   Musculoskeletal:  Positive for back pain and neck pain.  Skin: Negative.     Past Medical History:  Diagnosis Date   Chronic  low back pain without sciatica 12/21/2015   Medical history non-contributory    Supervision of high risk pregnancy, antepartum 06/07/2019   [facility]inic [facility] Prenatal Labs Dating  LMP = 8wk Blood type: O/Positive/-- (04/16 1452)  Genetic Screen NIPS: Normal xy Antibody:Negative (04/16 1452) Anatomic [place] complete Rubella: 1.86 (04/16 1452)  Varicella: immune GTT Early:       77         Third trimester: 94 RPR: Non Reactive (09/03 0939)  Rhogam Not needed HBsAg: Negative (04/16 1452)  Vaccines TDAP: 11/18/19                     Flu Shot: HI    Past Surgical History:  Procedure Laterality Date   ETHMOIDECTOMY Bilateral 05/13/2018   Procedure: ETHMOIDECTOMY;  Surgeon: Vernie Murders, MD;  Location: [facility];  Service: ENT;  Laterality: Bilateral;   IMAGE GUIDED SINUS SURGERY Bilateral 05/13/2018   Procedure: IMAGE GUIDED SINUS SURGERY WITH PROPEL IMPLANT;  Surgeon: Vernie Murders, MD;  Location: [facility];  Service: ENT;  Laterality: Bilateral;  stryker disk needed PROPEL IMPLANT PLACEMENT GAVE DISK TO CECE 2-28 KP   MAXILLARY ANTROSTOMY Bilateral 05/13/2018   Procedure: MAXILLARY ANTROSTOMY removal of tissue;  Surgeon: Vernie Murders, MD;  Location: [facility];  Service: ENT;  Laterality: Bilateral;   NASAL SINUS SURGERY Bilateral 05/13/2018   Procedure: FRONTAL  SINUSOTOMY;  Surgeon: Vernie Murders, MD;  Location: [facility];  Service: ENT;  Laterality: Bilateral;   nexplanon insertion  2012;10/21/2013;   SPHENOIDECTOMY Bilateral 05/13/2018   Procedure: SPHENOIDECTOMY with tissue removal;  Surgeon: Vernie Murders, MD;  Location: [facility];  Service: ENT;  Laterality: Bilateral;   TOOTH EXTRACTION     TUBAL LIGATION Bilateral 01/27/2020   Procedure: POST PARTUM TUBAL LIGATION;  Surgeon: Natale Milch, MD;  Location: [facility] OR[facility];  Service: Gynecology;  Laterality: Bilateral;   VAGINAL DELIVERY  01/26/2020      Current Outpatient Medications:    cetirizine  (ZYRTEC ALLERGY) 10 MG tablet, Take 1 tablet (10 mg total) by mouth daily., Disp: 14 tablet, Rfl: 0   fluticasone (FLONASE) 50 MCG/ACT nasal spray, Place 2 sprays into both nostrils daily., Disp: , Rfl:    ibuprofen (ADVIL) 800 MG tablet, Take 1 tablet (800 mg total) by mouth every 8 (eight) hours as needed., Disp: 30 tablet, Rfl: 0   meloxicam (MOBIC) 7.5 MG tablet, Take 7.5 mg by mouth daily., Disp: , Rfl:    montelukast (SINGULAIR) 10 MG tablet, Take 10 mg by mouth daily., Disp: , Rfl:    nitrofurantoin (MACRODANTIN) 100 MG capsule, Take 1 capsule (100 mg total) by mouth daily., Disp: 30 capsule, Rfl: 11   predniSONE (STERAPRED UNI-PAK 21 TAB) 10 MG (21) TBPK tablet, Take by mouth daily. As directed, Disp: 21 tablet, Rfl: 0   PROAIR HFA 108 (90 Base) MCG/ACT inhaler, Place 2 puffs into the nose every 6 (six) hours as needed for wheezing or shortness of breath. , Disp: , Rfl: 5   trimethoprim (TRIMPEX) 100 MG tablet, Take 100 mg by mouth daily., Disp: , Rfl:    Objective:   Vitals:   01/09/23 0945  BP: 114/74  Pulse: 70  SpO2: 100%    Physical Exam Vitals and nursing note reviewed.  Constitutional:      Appearance: Normal appearance.  HENT:     Head: Atraumatic.  Cardiovascular:     Rate and Rhythm: Normal rate.     Pulses: Normal pulses.  Pulmonary:     Effort: Pulmonary effort is normal.  Abdominal:     Palpations: Abdomen is soft.  Musculoskeletal:        General: No swelling or deformity.  Skin:    General: Skin is warm.     Capillary Refill: Capillary refill takes less than 2 seconds.     Coloration: Skin is not jaundiced.  Neurological:     Mental Status: She is alert and oriented to person, place, and time.  Psychiatric:        Mood and Affect: Mood normal.        Behavior: Behavior normal.        Thought Content: Thought content normal.        Judgment: Judgment normal.     Assessment & Plan:  Chronic bilateral thoracic back pain  Neck pain  Symptomatic  mammary hypertrophy  The procedure the patient selected and that was best for the patient was discussed. The risk were discussed and include but not limited to the following:  Breast asymmetry, fluid accumulation, firmness of the breast, inability to breast feed, loss of nipple or areola, skin loss, change in skin and nipple sensation, fat necrosis of the breast tissue, bleeding, infection and healing delay.  There are risks of anesthesia and injury to nerves or blood vessels.  Allergic reaction to tape, suture and skin glue are possible.  There will be swelling.  Any of these can lead to the need for revisional surgery which is not included in this surgery.  A breast reduction has potential to interfere with diagnostic procedures in the future.  This procedure is best done when the breast is fully developed.  Changes in the breast will continue to occur over time: pregnancy, weight gain or weigh loss. No guarantees are given for a certain bra or breast size.    Total time: 40 minutes. This includes time spent with the patient during the visit as well as time spent before and after the visit reviewing the chart, documenting the encounter, ordering pertinent studies and literature for the patient.   Physical therapy:  not required Mammogram:  not indicated  The patient is a good candidate for bilateral breast reduction with possible liposuction.  We will submit to insurance and see if we can get this covered.  Pictures were obtained of the patient and placed in the chart with the patient's or guardian's permission.   Alena Bills Thaddus Mcdowell, DO

## 2023-06-11 ENCOUNTER — Ambulatory Visit (INDEPENDENT_AMBULATORY_CARE_PROVIDER_SITE_OTHER)

## 2023-06-11 ENCOUNTER — Ambulatory Visit
Admission: EM | Admit: 2023-06-11 | Discharge: 2023-06-11 | Disposition: A | Discharge status: Home / Self Care | Attending: Emergency Medicine | Admitting: Emergency Medicine

## 2023-06-11 DIAGNOSIS — M25561 Pain in right knee: Secondary | ICD-10-CM | POA: Diagnosis not present

## 2023-06-11 DIAGNOSIS — J302 Other seasonal allergic rhinitis: Secondary | ICD-10-CM | POA: Diagnosis not present

## 2023-06-11 NOTE — ED Triage Notes (Signed)
 Sinus pain and pressure, congestion x 2 days. Taking zyrtec and Flonase.   Right knee pain that started yesterday. No recent fall or injury. Pt states it hurts when trying to walk or put pressure on it.

## 2023-06-11 NOTE — Discharge Instructions (Addendum)
 Wear the knee sleeve as directed.  Rest and elevate your knee.  Apply ice packs as directed.  Follow-up with an orthopedist if your symptoms are not improving.    Continue Zyrtec and Flonase for seasonal allergies.  Follow-up with your primary care provider if your symptoms are not improving.

## 2023-06-11 NOTE — ED Provider Notes (Signed)
 Abigail Mejia    CSN: [id number] Arrival date & time: 06/11/23  1224      History   Chief Complaint Chief Complaint  Patient presents with   Facial Pain   Knee Pain    HPI Abigail Mejia is a 33 y.o. female.  Patient presents with right knee pain since yesterday.  No falls or injury.  The pain started while walking.  Treatment attempted with ibuprofen.  The pain is worse with weightbearing and ambulation.  No numbness, weakness, bruising, redness, wounds.  She had similar knee pains 3 years ago and was seen at [facility].  Patient also presents with postnasal drip, congestion, runny nose x 2 days.  Treatment attempted with Zyrtec and Flonase.  No fever, sore throat, cough, shortness of breath.  The history is provided by the patient and medical records.    Past Medical History:  Diagnosis Date   Chronic low back pain without sciatica 12/21/2015   Medical history non-contributory    Supervision of high risk pregnancy, antepartum 06/07/2019   [facility] Prenatal Labs Dating  LMP = 8wk Blood type: O/Positive/-- (04/16 1452)  Genetic Screen NIPS: Normal xy Antibody:Negative (04/16 1452) Anatomic [place] complete Rubella: 1.86 (04/16 1452)  Varicella: immune GTT Early:       77         Third trimester: 94 RPR: Non Reactive (09/03 0939)  Rhogam Not needed HBsAg: Negative (04/16 1452)  Vaccines TDAP: 11/18/19                     Flu Shot: HI    Patient Active Problem List   Diagnosis Date Noted   Back pain 01/09/2023   Neck pain 01/09/2023   Symptomatic mammary hypertrophy 01/09/2023   S/P tubal ligation 05/07/2021   Encounter for female sterilization procedure    BMI 40.0-44.9, adult (HCC) 06/07/2019    Past Surgical History:  Procedure Laterality Date   ETHMOIDECTOMY Bilateral 05/13/2018   Procedure: ETHMOIDECTOMY;  Surgeon: Vernie Murders, MD;  Location: [facility];  Service: ENT;  Laterality: Bilateral;   IMAGE GUIDED SINUS SURGERY Bilateral 05/13/2018    Procedure: IMAGE GUIDED SINUS SURGERY WITH PROPEL IMPLANT;  Surgeon: Vernie Murders, MD;  Location: [facility];  Service: ENT;  Laterality: Bilateral;  stryker disk needed PROPEL IMPLANT PLACEMENT GAVE DISK TO CECE 2-28 KP   MAXILLARY ANTROSTOMY Bilateral 05/13/2018   Procedure: MAXILLARY ANTROSTOMY removal of tissue;  Surgeon: Vernie Murders, MD;  Location: [facility];  Service: ENT;  Laterality: Bilateral;   NASAL SINUS SURGERY Bilateral 05/13/2018   Procedure: FRONTAL SINUSOTOMY;  Surgeon: Vernie Murders, MD;  Location: [facility];  Service: ENT;  Laterality: Bilateral;   nexplanon insertion  2012;10/21/2013;   SPHENOIDECTOMY Bilateral 05/13/2018   Procedure: SPHENOIDECTOMY with tissue removal;  Surgeon: Vernie Murders, MD;  Location: [facility];  Service: ENT;  Laterality: Bilateral;   TOOTH EXTRACTION     TUBAL LIGATION Bilateral 01/27/2020   Procedure: POST PARTUM TUBAL LIGATION;  Surgeon: Natale Milch, MD;  Location: [facility] ORS;  Service: Gynecology;  Laterality: Bilateral;   VAGINAL DELIVERY  01/26/2020    OB History     Gravida  3   Para  3   Term  3   Preterm      AB      Living  3      SAB      IAB      Ectopic  Multiple  0   Live Births  3            Home Medications    Prior to Admission medications   Medication Sig Start Date End Date Taking? Authorizing Provider  cetirizine (ZYRTEC ALLERGY) 10 MG tablet Take 1 tablet (10 mg total) by mouth daily. 10/12/22  Yes Mickie Bail, NP  fluticasone (FLONASE) 50 MCG/ACT nasal spray Place 2 sprays into both nostrils daily. 07/24/19  Yes [provider]  montelukast (SINGULAIR) 10 MG tablet Take 10 mg by mouth daily. 11/18/18  Yes [provider]  ibuprofen (ADVIL) 800 MG tablet Take 1 tablet (800 mg total) by mouth every 8 (eight) hours as needed. 05/27/21   Menshew, Charlesetta Ivory, PA-C  meloxicam (MOBIC) 7.5 MG tablet Take 7.5 mg by mouth daily.  10/28/20   [provider]  nitrofurantoin (MACRODANTIN) 100 MG capsule Take 1 capsule (100 mg total) by mouth daily. 06/16/22   MacDiarmid, Lorin Picket, MD  predniSONE (STERAPRED UNI-PAK 21 TAB) 10 MG (21) TBPK tablet Take by mouth daily. As directed 10/12/22   Mickie Bail, NP  PROAIR HFA 108 [phone] Base) MCG/ACT inhaler Place 2 puffs into the nose every 6 (six) hours as needed for wheezing or shortness of breath.  06/12/17   [provider]  trimethoprim (TRIMPEX) 100 MG tablet Take 100 mg by mouth daily.    [provider]    Family History History reviewed. No pertinent family history.  Social History Social History   Tobacco Use   Smoking status: Never   Smokeless tobacco: Never  Vaping Use   Vaping status: Never Used  Substance Use Topics   Alcohol use: No   Drug use: No     Allergies   Patient has no known allergies.   Review of Systems Review of Systems  Constitutional:  Negative for chills and fever.  HENT:  Positive for congestion, postnasal drip and rhinorrhea. Negative for ear pain and sore throat.   Respiratory:  Negative for cough and shortness of breath.   Musculoskeletal:  Positive for arthralgias and gait problem. Negative for joint swelling.  Skin:  Negative for color change, rash and wound.  Neurological:  Negative for weakness and numbness.     Physical Exam Triage Vital Signs ED Triage Vitals  Encounter Vitals Group     BP 06/11/23 1244 113/77     Systolic BP Percentile --      Diastolic BP Percentile --      Pulse Rate 06/11/23 1244 78     Resp 06/11/23 1244 18     Temp 06/11/23 1244 98 F (36.7 C)     Temp src --      SpO2 06/11/23 1244 98 %     Weight --      Height --      Head Circumference --      Peak Flow --      Pain Score 06/11/23 1308 10     Pain Loc --      Pain Education --      Exclude from Growth Chart --    No data found.  Updated Vital Signs BP 113/77   Pulse 78   Temp 98 F (36.7 C)   Resp 18    LMP 06/02/2023 (Approximate)   SpO2 98%   Breastfeeding Yes   Visual Acuity Right Eye Distance:   Left Eye Distance:   Bilateral Distance:    Right Eye Near:  Left Eye Near:    Bilateral Near:     Physical Exam Constitutional:      General: She is not in acute distress. HENT:     Right Ear: Tympanic membrane normal.     Left Ear: Tympanic membrane normal.     Nose: Rhinorrhea present.     Mouth/Throat:     Mouth: Mucous membranes are moist.     Pharynx: Oropharynx is clear.  Cardiovascular:     Rate and Rhythm: Normal rate and regular rhythm.     Heart sounds: Normal heart sounds.  Pulmonary:     Effort: Pulmonary effort is normal. No respiratory distress.     Breath sounds: Normal breath sounds.  Musculoskeletal:        General: No swelling, tenderness or deformity. Normal range of motion.  Skin:    General: Skin is warm and dry.     Capillary Refill: Capillary refill takes less than 2 seconds.     Findings: No bruising, erythema, lesion or rash.  Neurological:     General: No focal deficit present.     Mental Status: She is alert and oriented to person, place, and time.     Sensory: No sensory deficit.     Motor: No weakness.     Gait: Gait abnormal.     Comments: Limping gait      UC Treatments / Results  Labs (all labs ordered are listed, but only abnormal results are displayed) Labs Reviewed - No data to display  EKG   Radiology DG Knee Complete 4 Views Right Result Date: 06/11/2023 CLINICAL DATA:  Atraumatic right knee pain. EXAM: RIGHT KNEE - COMPLETE 4+ VIEW COMPARISON:  None Available. FINDINGS: No evidence of fracture, dislocation, or joint effusion. Alignment and joint spaces are normal. No evidence of arthropathy or other focal bone abnormality. Soft tissues are unremarkable. IMPRESSION: Negative radiographs of the right knee. Electronically Signed   By: Narda Rutherford M.D.   On: 06/11/2023 15:08    Procedures Procedures (including critical  care time)  Medications Ordered in UC Medications - No data to display  Initial Impression / Assessment and Plan / UC Course  I have reviewed the triage vital signs and the nursing notes.  Pertinent labs & imaging results that were available during my care of the patient were reviewed by me and considered in my medical decision making (see chart for details).    Right knee pain, seasonal allergies, lactating mother.  Xray of knee negative.  Treating with knee sleeve, rest, elevation, ice packs, ibuprofen.  Education provided on knee pain.  Instructed patient to follow-up with orthopedics if her symptoms are not improving.  Contact information provided for on-call Ortho.    Treating allergy symptoms with continued use of Zyrtec and Flonase.  Education provided on allergic rhinitis.  Instructed patient to follow-up with her PCP if she is not improving.  She agrees to plan of care.  Final Clinical Impressions(s) / UC Diagnoses   Final diagnoses:  Acute pain of right knee  Seasonal allergies  Lactating mother     Discharge Instructions      Wear the knee sleeve as directed.  Rest and elevate your knee.  Apply ice packs as directed.  Follow-up with an orthopedist if your symptoms are not improving.    Continue Zyrtec and Flonase for seasonal allergies.  Follow-up with your primary care provider if your symptoms are not improving.     ED Prescriptions   None  PDMP not reviewed this encounter.   Mickie Bail, NP 06/11/23 [phone]

## 2023-08-06 ENCOUNTER — Ambulatory Visit
Admission: RE | Admit: 2023-08-06 | Discharge: 2023-08-06 | Disposition: A | Discharge status: Home / Self Care | Source: Ambulatory Visit | Attending: Emergency Medicine | Admitting: Emergency Medicine

## 2023-08-06 VITALS — BP 104/64 | HR 80 | Temp 98.9°F | Resp 18

## 2023-08-06 DIAGNOSIS — Z113 Encounter for screening for infections with a predominantly sexual mode of transmission: Secondary | ICD-10-CM | POA: Diagnosis not present

## 2023-08-06 DIAGNOSIS — R300 Dysuria: Secondary | ICD-10-CM | POA: Diagnosis present

## 2023-08-06 LAB — POCT URINALYSIS DIP (MANUAL ENTRY)
Bilirubin, UA: NEGATIVE
Blood, UA: NEGATIVE
Glucose, UA: NEGATIVE mg/dL
Leukocytes, UA: NEGATIVE
Nitrite, UA: NEGATIVE
Spec Grav, UA: 1.025 (ref 1.010–1.025)
Urobilinogen, UA: 1 U/dL
pH, UA: 7 (ref 5.0–8.0)

## 2023-08-06 NOTE — ED Provider Notes (Signed)
 Arlander Bellman    CSN: [id number] Arrival date & time: 08/06/23  1711      History   Chief Complaint Chief Complaint  Patient presents with   Urinary Frequency    And std  screening - Entered by patient    HPI Abigail Mejia is a 33 y.o. female.  Patient presents with 2-day history of dysuria and urinary frequency.  She reports some lower abdominal discomfort when she urinates.  She denies fever, chills, hematuria, flank pain, nausea, vomiting, diarrhea, constipation, vaginal discharge, pelvic pain.  No OTC medications taken today.  The history is provided by the patient and medical records.    Past Medical History:  Diagnosis Date   Chronic low back pain without sciatica 12/21/2015   Medical history non-contributory    Supervision of high risk pregnancy, antepartum 06/07/2019   [facility] Prenatal Labs Dating  LMP = 8wk Blood type: O/Positive/-- (04/16 1452)  Genetic Screen NIPS: Normal xy Antibody:Negative (04/16 1452) Anatomic US  complete Rubella: 1.86 (04/16 1452)  Varicella: immune GTT Early:       77         Third trimester: 94 RPR: Non Reactive (09/03 0939)  Rhogam Not needed HBsAg: Negative (04/16 1452)  Vaccines TDAP: 11/18/19                     Flu Shot: HI    Patient Active Problem List   Diagnosis Date Noted   Back pain 01/09/2023   Neck pain 01/09/2023   Symptomatic mammary hypertrophy 01/09/2023   S/P tubal ligation 05/07/2021   Encounter for female sterilization procedure    BMI 40.0-44.9, adult (HCC) 06/07/2019    Past Surgical History:  Procedure Laterality Date   ETHMOIDECTOMY Bilateral 05/13/2018   Procedure: ETHMOIDECTOMY;  Surgeon: Mellody Sprout, MD;  Location: [facility];  Service: ENT;  Laterality: Bilateral;   IMAGE GUIDED SINUS SURGERY Bilateral 05/13/2018   Procedure: IMAGE GUIDED SINUS SURGERY WITH PROPEL IMPLANT;  Surgeon: Juengel, Paul, MD;  Location: [facility];  Service: ENT;  Laterality: Bilateral;  stryker disk  needed PROPEL IMPLANT PLACEMENT GAVE DISK TO CECE 2-28 KP   MAXILLARY ANTROSTOMY Bilateral 05/13/2018   Procedure: MAXILLARY ANTROSTOMY removal of tissue;  Surgeon: Mellody Sprout, MD;  Location: [facility];  Service: ENT;  Laterality: Bilateral;   NASAL SINUS SURGERY Bilateral 05/13/2018   Procedure: FRONTAL SINUSOTOMY;  Surgeon: Mellody Sprout, MD;  Location: [facility];  Service: ENT;  Laterality: Bilateral;   nexplanon insertion  2012;10/21/2013;   SPHENOIDECTOMY Bilateral 05/13/2018   Procedure: SPHENOIDECTOMY with tissue removal;  Surgeon: Mellody Sprout, MD;  Location: [facility];  Service: ENT;  Laterality: Bilateral;   TOOTH EXTRACTION     TUBAL LIGATION Bilateral 01/27/2020   Procedure: POST PARTUM TUBAL LIGATION;  Surgeon: Heron Lord, MD;  Location: [facility] OR[facility];  Service: Gynecology;  Laterality: Bilateral;   VAGINAL DELIVERY  01/26/2020    OB History     Gravida  3   Para  3   Term  3   Preterm      AB      Living  3      SAB      IAB      Ectopic      Multiple  0   Live Births  3            Home Medications    Prior to Admission medications   Medication Sig  Start Date End Date Taking? Authorizing Provider  cetirizine  (ZYRTEC  ALLERGY) 10 MG tablet Take 1 tablet (10 mg total) by mouth daily. 10/12/22   Wellington Half, NP  fluticasone  (FLONASE ) 50 MCG/ACT nasal spray Place 2 sprays into both nostrils daily. 07/24/19   [provider]  ibuprofen  (ADVIL ) 800 MG tablet Take 1 tablet (800 mg total) by mouth every 8 (eight) hours as needed. 05/27/21   Menshew, Raye Cai, PA-C  meloxicam  (MOBIC ) 7.5 MG tablet Take 7.5 mg by mouth daily. 10/28/20   [provider]  montelukast (SINGULAIR) 10 MG tablet Take 10 mg by mouth daily. 11/18/18   [provider]  nitrofurantoin  (MACRODANTIN ) 100 MG capsule Take 1 capsule (100 mg total) by mouth daily. 06/16/22   MacDiarmid, Geralyn Knee, MD  predniSONE  (STERAPRED  UNI-PAK 21 TAB) 10 MG (21) TBPK tablet Take by mouth daily. As directed 10/12/22   Wellington Half, NP  PROAIR HFA 108 (90 Base) MCG/ACT inhaler Place 2 puffs into the nose every 6 (six) hours as needed for wheezing or shortness of breath.  06/12/17   [provider]  trimethoprim  (TRIMPEX ) 100 MG tablet Take 100 mg by mouth daily.    [provider]    Family History History reviewed. No pertinent family history.  Social History Social History   Tobacco Use   Smoking status: Never   Smokeless tobacco: Never  Vaping Use   Vaping status: Never Used  Substance Use Topics   Alcohol use: No   Drug use: No     Allergies   Patient has no known allergies.   Review of Systems Review of Systems  Constitutional:  Negative for chills and fever.  Gastrointestinal:  Positive for abdominal pain. Negative for blood in stool, constipation, diarrhea, nausea and vomiting.  Genitourinary:  Positive for dysuria and frequency. Negative for flank pain, hematuria, pelvic pain and vaginal discharge.     Physical Exam Triage Vital Signs ED Triage Vitals  Encounter Vitals Group     BP      Systolic BP Percentile      Diastolic BP Percentile      Pulse      Resp      Temp      Temp src      SpO2      Weight      Height      Head Circumference      Peak Flow      Pain Score      Pain Loc      Pain Education      Exclude from Growth Chart    No data found.  Updated Vital Signs BP 104/64 (BP Location: Right Arm)   Pulse 80   Temp 98.9 F (37.2 C) (Oral)   Resp 18   LMP 08/03/2023   SpO2 100%   Breastfeeding Yes   Visual Acuity Right Eye Distance:   Left Eye Distance:   Bilateral Distance:    Right Eye Near:   Left Eye Near:    Bilateral Near:     Physical Exam Constitutional:      General: She is not in acute distress. HENT:     Mouth/Throat:     Mouth: Mucous membranes are moist.  Cardiovascular:     Rate and Rhythm: Normal rate and regular rhythm.   Pulmonary:     Effort: Pulmonary effort is normal. No respiratory distress.  Abdominal:     General: Bowel sounds are  normal.     Palpations: Abdomen is soft.     Tenderness: There is no abdominal tenderness. There is no right CVA tenderness, left CVA tenderness, guarding or rebound.  Neurological:     Mental Status: She is alert.      UC Treatments / Results  Labs (all labs ordered are listed, but only abnormal results are displayed) Labs Reviewed  POCT URINALYSIS DIP (MANUAL ENTRY) - Abnormal; Notable for the following components:      Result Value   Color, UA straw (*)    Ketones, POC UA trace (5) (*)    Protein Ur, POC trace (*)    All other components within normal limits  CERVICOVAGINAL ANCILLARY ONLY    EKG   Radiology No results found.  Procedures Procedures (including critical care time)  Medications Ordered in UC Medications - No data to display  Initial Impression / Assessment and Plan / UC Course  I have reviewed the triage vital signs and the nursing notes.  Pertinent labs & imaging results that were available during my care of the patient were reviewed by me and considered in my medical decision making (see chart for details).    Dysuria, STD screening.  Urine does not indicate infection at this time.  Patient obtained vaginal self swab for STD testing.  Discussed that her test results will be available in MyChart and that we will call her if treatment is needed.  Instructed her to abstain from sexual activity until test results are back.  Education provided on dysuria.  Instructed patient to follow up with her PCP if her symptoms are not improving.  She agrees to plan of care.   Final Clinical Impressions(s) / UC Diagnoses   Final diagnoses:  Dysuria  Screening for STD (sexually transmitted disease)     Discharge Instructions      Your urine does not show signs of infection at this time.  Your vaginal tests are pending.  Your test results will  be available in your MyChart account.  We will call you if treatment is needed.  Do not have any sexual activity until all test results are back and treatment is completed if needed.  Follow-up with your primary care provider if your symptoms are not improving.    ED Prescriptions   None    PDMP not reviewed this encounter.   Wellington Half, NP 08/06/23 [phone]

## 2023-08-06 NOTE — Discharge Instructions (Addendum)
 Your urine does not show signs of infection at this time.  Your vaginal tests are pending.  Your test results will be available in your MyChart account.  We will call you if treatment is needed.  Do not have any sexual activity until all test results are back and treatment is completed if needed.  Follow-up with your primary care provider if your symptoms are not improving.

## 2023-08-06 NOTE — ED Triage Notes (Signed)
 Urinary frequency and burning that started 08/04/2023. Patient denies pain. Patient reports she has not taken anything for symptoms.

## 2023-08-07 LAB — CERVICOVAGINAL ANCILLARY ONLY
Bacterial Vaginitis (gardnerella): POSITIVE — AB
Candida Glabrata: NEGATIVE
Candida Vaginitis: NEGATIVE
Chlamydia: NEGATIVE
Comment: NEGATIVE
Comment: NEGATIVE
Comment: NEGATIVE
Comment: NEGATIVE
Comment: NEGATIVE
Comment: NORMAL
Neisseria Gonorrhea: NEGATIVE
Trichomonas: NEGATIVE

## 2023-08-10 ENCOUNTER — Ambulatory Visit (HOSPITAL_COMMUNITY): Payer: Self-pay

## 2023-08-10 MED ORDER — METRONIDAZOLE 500 MG PO TABS: 500.0000 mg | ORAL_TABLET | Freq: Two times a day (BID) | ORAL | 0 refills | Status: AC

## 2023-08-20 ENCOUNTER — Other Ambulatory Visit: Payer: Self-pay

## 2023-08-20 ENCOUNTER — Emergency Department

## 2023-08-20 ENCOUNTER — Emergency Department
Admission: EM | Admit: 2023-08-20 | Discharge: 2023-08-20 | Disposition: A | Discharge status: Home / Self Care | Attending: Emergency Medicine | Admitting: Emergency Medicine

## 2023-08-20 DIAGNOSIS — R8289 Other abnormal findings on cytological and histological examination of urine: Secondary | ICD-10-CM | POA: Diagnosis not present

## 2023-08-20 DIAGNOSIS — M545 Low back pain, unspecified: Secondary | ICD-10-CM | POA: Diagnosis present

## 2023-08-20 LAB — URINALYSIS, ROUTINE W REFLEX MICROSCOPIC
Bilirubin Urine: NEGATIVE
Glucose, UA: NEGATIVE mg/dL
Hgb urine dipstick: NEGATIVE
Ketones, ur: 20 mg/dL — AB
Nitrite: NEGATIVE
Protein, ur: NEGATIVE mg/dL
Specific Gravity, Urine: 1.019 (ref 1.005–1.030)
pH: 5 (ref 5.0–8.0)

## 2023-08-20 LAB — COMPREHENSIVE METABOLIC PANEL WITH GFR
ALT: 11 U/L (ref 0–44)
AST: 16 U/L (ref 15–41)
Albumin: 3.8 g/dL (ref 3.5–5.0)
Alkaline Phosphatase: 37 U/L — ABNORMAL LOW (ref 38–126)
Anion gap: 7 (ref 5–15)
BUN: 9 mg/dL (ref 6–20)
CO2: 24 mmol/L (ref 22–32)
Calcium: 8.2 mg/dL — ABNORMAL LOW (ref 8.9–10.3)
Chloride: 107 mmol/L (ref 98–111)
Creatinine, Ser: 0.68 mg/dL (ref 0.44–1.00)
GFR, Estimated: >60 mL/min (ref >60)
Glucose, Bld: 88 mg/dL (ref 70–99)
Potassium: 3.9 mmol/L (ref 3.5–5.1)
Sodium: 138 mmol/L (ref 135–145)
Total Bilirubin: 1 mg/dL (ref 0.0–1.2)
Total Protein: 6.4 g/dL — ABNORMAL LOW (ref 6.5–8.1)

## 2023-08-20 LAB — POC URINE PREG, ED: Preg Test, Ur: NEGATIVE

## 2023-08-20 LAB — CBC
HCT: 37 % (ref 36.0–46.0)
Hemoglobin: 12.5 g/dL (ref 12.0–15.0)
MCH: 32 pg (ref 26.0–34.0)
MCHC: 33.8 g/dL (ref 30.0–36.0)
MCV: 94.6 fL (ref 80.0–100.0)
Platelets: 265 10*3/uL (ref 150–400)
RBC: 3.91 MIL/uL (ref 3.87–5.11)
RDW: 12.3 % (ref 11.5–15.5)
WBC: 4.2 10*3/uL (ref 4.0–10.5)
nRBC: 0 % (ref 0.0–0.2)

## 2023-08-20 LAB — LIPASE, BLOOD: Lipase: 29 U/L (ref 11–51)

## 2023-08-20 MED ORDER — CEFDINIR 300 MG PO CAPS: 300.0000 mg | ORAL_CAPSULE | Freq: Two times a day (BID) | ORAL | 0 refills | Status: AC

## 2023-08-20 MED ORDER — KETOROLAC TROMETHAMINE 15 MG/ML IJ SOLN
15.0000 mg | Freq: Once | INTRAMUSCULAR | Status: AC
  Administered 2023-08-20: 15 mg via INTRAVENOUS
  Filled 2023-08-20: qty 1

## 2023-08-20 MED ORDER — SODIUM CHLORIDE 0.9 % IV BOLUS
1000.0000 mL | Freq: Once | INTRAVENOUS | Status: AC
  Administered 2023-08-20: 1000 mL via INTRAVENOUS

## 2023-08-20 MED ORDER — ONDANSETRON 4 MG PO TBDP
4.0000 mg | ORAL_TABLET | Freq: Three times a day (TID) | ORAL | 0 refills | Status: AC | PRN
Start: 2023-08-20 — End: ?

## 2023-08-20 MED ORDER — ONDANSETRON 4 MG PO TBDP
4.0000 mg | ORAL_TABLET | Freq: Once | ORAL | Status: AC
  Administered 2023-08-20: 4 mg via ORAL
  Filled 2023-08-20: qty 1

## 2023-08-20 NOTE — Discharge Instructions (Signed)
 Provider or urgent care if any continued problems or concerns.  Return to the emergency department if any severe worsening of your symptoms.  Increase fluids to stay hydrated.  A prescription for Zofran  as needed for nausea was sent to the pharmacy along with prescription for antibiotics.  Continue to take the antibiotics until completely finished.  You may take Tylenol  or ibuprofen  as needed for back pain.

## 2023-08-20 NOTE — ED Triage Notes (Signed)
 Pt to ED via POV from home. Pt reports lower back pain x2 days w/ radiation to abdomen. Pt reports vomiting last pm. Pt reports was on antibiotics a week ago for bladder infection. Pt reports red tint to urine when wiping.

## 2023-08-20 NOTE — ED Notes (Signed)
 Pt ambulatory  to RM without assistance

## 2023-08-20 NOTE — ED Provider Notes (Signed)
 [facility] Provider Note    Event Date/Time   First MD Initiated Contact with Patient 08/20/23 [phone]     (approximate)   History   Back Pain   HPI  Abigail Mejia is a 33 y.o. female   presents to the ED with complaint of low back pain for 2 days with radiation to her abdomen.  Patient states she vomited last evening and also this morning prior to arrival.  She recently was treated for a urinary tract infection with antibiotics which she believes may have been Macrodantin .  She noticed a red tent to the toilet tissue this morning when she was wiping after urinating.  She denies any vaginal discharge.  Patient has a history of chronic low back pain without sciatica.  No over-the-counter medications been taken.      Physical Exam   Triage Vital Signs: ED Triage Vitals  Encounter Vitals Group     BP 08/20/23 0827 123/67     Girls Systolic BP Percentile --      Girls Diastolic BP Percentile --      Boys Systolic BP Percentile --      Boys Diastolic BP Percentile --      Pulse Rate 08/20/23 0827 80     Resp 08/20/23 0827 17     Temp 08/20/23 0827 98.5 F (36.9 C)     Temp Source 08/20/23 0827 Oral     SpO2 08/20/23 0827 99 %     Weight 08/20/23 0825 219 lb (99.3 kg)     Height 08/20/23 0825 5' 4 (1.626 m)     Head Circumference --      Peak Flow --      Pain Score 08/20/23 0825 8     Pain Loc --      Pain Education --      Exclude from Growth Chart --     Most recent vital signs: Vitals:   08/20/23 0827  BP: 123/67  Pulse: 80  Resp: 17  Temp: 98.5 F (36.9 C)  SpO2: 99%     General: Awake, no distress.  CV:  Good peripheral perfusion.  Heart regular rate rhythm. Resp:  Normal effort.  Lungs clear bilaterally. Abd:  No distention.  Soft, nontender, bowel sounds normoactive x 4 quadrants.  Generalized tenderness on palpation bilateral paravertebral muscles lumbar area.  Range of motion without restriction.  Patient is able to stand and  ambulate without any assistance.  No point tenderness on palpation of the lumbar spine. Other:     ED Results / Procedures / Treatments   Labs (all labs ordered are listed, but only abnormal results are displayed) Labs Reviewed  COMPREHENSIVE METABOLIC PANEL WITH GFR - Abnormal; Notable for the following components:      Result Value   Calcium 8.2 (*)    Total Protein 6.4 (*)    Alkaline Phosphatase 37 (*)    All other components within normal limits  URINALYSIS, ROUTINE W REFLEX MICROSCOPIC - Abnormal; Notable for the following components:   Color, Urine AMBER (*)    APPearance HAZY (*)    Ketones, ur 20 (*)    Leukocytes,Ua SMALL (*)    Bacteria, UA FEW (*)    All other components within normal limits  LIPASE, BLOOD  CBC  POC URINE PREG, ED      RADIOLOGY CT renal stone study images per radiology is negative for kidney stone or hydronephrosis.  There is some decidual fecal  material without obstruction or constipation.    PROCEDURES:  Critical Care performed:   Procedures   MEDICATIONS ORDERED IN ED: Medications  ondansetron  (ZOFRAN -ODT) disintegrating tablet 4 mg (4 mg Oral Given 08/20/23 0843)  sodium chloride  0.9 % bolus 1,000 mL (0 mLs Intravenous Stopped 08/20/23 1035)  ketorolac (TORADOL) 15 MG/ML injection 15 mg (15 mg Intravenous Given 08/20/23 1033)     IMPRESSION / MDM / ASSESSMENT AND PLAN / ED COURSE  I reviewed the triage vital signs and the nursing notes.   Differential diagnosis includes, but is not limited to, urinary tract infection, urolithiasis, obstruction, gastroenteritis, musculoskeletal pain.  33 year old female presents to the ED with complaint of low back pain with history of recent UTI.  Patient took Macrodantin  and states most of her symptoms has improved.  She has had some nausea associated with her low back pain.  No previous urolithiasis and she did not denies any fever or chills.  Urinalysis was hazy with small leukocytes and  bacteria.  Also ketones were 20.  Patient was given IV fluids while in the ED along with Zofran  and nausea improved and no continued vomiting.  A prescription for Zofran  and cefdinir was sent to the pharmacy.  Lab work and CT were discussed with patient.  Toradol 50 mg IV was given while in the ED with improvement.  She has to follow-up with her PCP or return to the emergency department if any continued problems or urgent concerns.  She is also encouraged to take Tylenol  or ibuprofen  if needed for her back pain.      Patient's presentation is most consistent with acute complicated illness / injury requiring diagnostic workup.  FINAL CLINICAL IMPRESSION(S) / ED DIAGNOSES   Final diagnoses:  Acute low back pain without sciatica, unspecified back pain laterality     Rx / DC Orders   ED Discharge Orders          Ordered    cefdinir (OMNICEF) 300 MG capsule  2 times daily        08/20/23 1030    ondansetron  (ZOFRAN -ODT) 4 MG disintegrating tablet  Every 8 hours PRN        08/20/23 1031             Note:  This document was prepared using Dragon voice recognition software and may include unintentional dictation errors.   Stafford Eagles, PA-C 08/20/23 1145    Lubertha Rush, MD 08/20/23 [phone]

## 2024-02-29 NOTE — Progress Notes (Deleted)
 "    Gynecology Annual Exam   PCP: Pcp, No  Chief Complaint: No chief complaint on file.   History of Present Illness: Patient is a 33 y.o. G3P3003 presents for annual exam. The patient has no complaints today.   LMP: No LMP recorded. (Menstrual status: Bilateral Tubal Ligation). Average Interval: {Desc; regular/irreg:14544}, {numbers 22-35:14824} days Duration of flow: {numbers; 0-10:33138} days Heavy Menses: {yes/no:63} Clots: {yes/no:63} Intermenstrual Bleeding: {yes/no:63} Postcoital Bleeding: {yes/no:63} Dysmenorrhea: {yes/no:63}  The patient {sys sexually active:13135} sexually active. She currently uses {method:5051} for contraception. She {has/denies:315300} dyspareunia.  The patient {DOES_DOES WNU:81435} perform self breast exams.  There {is/is no:19420} notable family history of breast or ovarian cancer in her family.  The patient wears seatbelts: {yes/no:63}.   The patient has regular exercise: {yes/no/not asked:9010}.    The patient {Blank single:19197::reports,denies} current symptoms of depression.    Review of Systems: ROS  Past Medical History:  Patient Active Problem List   Diagnosis Date Noted Date Diagnosed   Back pain 01/09/2023    Neck pain 01/09/2023    Symptomatic mammary hypertrophy 01/09/2023    S/P tubal ligation 05/07/2021    Encounter for female sterilization procedure     BMI 40.0-44.9, adult (HCC) 06/07/2019     Past Surgical History:  Past Surgical History:  Procedure Laterality Date   ETHMOIDECTOMY Bilateral 05/13/2018   Procedure: ETHMOIDECTOMY;  Surgeon: Edda Mt, MD;  Location: [facility];  Service: ENT;  Laterality: Bilateral;   IMAGE GUIDED SINUS SURGERY Bilateral 05/13/2018   Procedure: IMAGE GUIDED SINUS SURGERY WITH PROPEL IMPLANT;  Surgeon: Juengel, Paul, MD;  Location: [facility];  Service: ENT;  Laterality: Bilateral;  stryker disk needed PROPEL IMPLANT PLACEMENT GAVE DISK TO CECE 2-28 KP   MAXILLARY  ANTROSTOMY Bilateral 05/13/2018   Procedure: MAXILLARY ANTROSTOMY removal of tissue;  Surgeon: Edda Mt, MD;  Location: [facility];  Service: ENT;  Laterality: Bilateral;   NASAL SINUS SURGERY Bilateral 05/13/2018   Procedure: FRONTAL SINUSOTOMY;  Surgeon: Edda Mt, MD;  Location: [facility];  Service: ENT;  Laterality: Bilateral;   nexplanon insertion  2012;10/21/2013;   SPHENOIDECTOMY Bilateral 05/13/2018   Procedure: SPHENOIDECTOMY with tissue removal;  Surgeon: Edda Mt, MD;  Location: [facility];  Service: ENT;  Laterality: Bilateral;   TOOTH EXTRACTION     TUBAL LIGATION Bilateral 01/27/2020   Procedure: POST PARTUM TUBAL LIGATION;  Surgeon: Victor Claudell SAUNDERS, MD;  Location: [facility];  Service: Gynecology;  Laterality: Bilateral;   VAGINAL DELIVERY  01/26/2020    Gynecologic History:  No LMP recorded. (Menstrual status: Bilateral Tubal Ligation). Contraception: {method:5051} Last Pap: Results were: {Findings; lab pap smear results:16707::NIL and HR HPV+,NIL and HR HPV negative}   Obstetric History: [id number]  Family History:  No family history on file.  Social History:  Social History   Socioeconomic History   Marital status: Single    Spouse name: Not on file   Number of children: 2   Years of education: 12   Highest education level: Not on file  Occupational History   Occupation: [employer]  Tobacco Use   Smoking status: Never   Smokeless tobacco: Never  Vaping Use   Vaping status: Never Used  Substance and Sexual Activity   Alcohol use: No   Drug use: No   Sexual activity: Yes    Birth control/protection: Surgical  Other Topics Concern   Not on file  Social History Narrative   Not on file   Social Drivers  of Health   Tobacco Use: Low Risk (02/02/2024)   Received from [facility]   Patient History    Smoking Tobacco Use: Never    Smokeless Tobacco Use: Never    Passive Exposure: Never   Financial Resource Strain: Low Risk (05/05/2023)   Received from [facility]   Overall Financial Resource Strain (CARDIA)    Difficulty of Paying Living Expenses: Not hard at all  Food Insecurity: No Food Insecurity (05/05/2023)   Received from [facility]   Epic    Within the past 12 months, you worried that your food would run out before you got the money to buy more.: Never true    Within the past 12 months, the food you bought just didn't last and you didn't have money to get more.: Never true  Transportation Needs: No Transportation Needs (05/05/2023)   Received from [facility] - Transportation    Lack of Transportation (Medical): No    Lack of Transportation (Non-Medical): No  Physical Activity: Not on file  Stress: Not on file  Social Connections: Not on file  Intimate Partner Violence: Not on file  Depression (EYV7-0): Not on file  Alcohol Screen: Not on file  Housing: Low Risk (05/05/2023)   Received from [facility]    In the last 12 months, was there a time when you were not able to pay the mortgage or rent on time?: No    In the past 12 months, how many times have you moved where you were living?: 0    At any time in the past 12 months, were you homeless or living in a shelter (including now)?: No  Utilities: Not At Risk (05/05/2023)   Received from [facility] Utilities    Threatened with loss of utilities: No  Health Literacy: Not on file    Allergies:  Allergies[1]  Medications: Prior to Admission medications  Medication Sig Start Date End Date Taking? Authorizing Provider  cefdinir  (OMNICEF ) 300 MG capsule Take 1 capsule (300 mg total) by mouth 2 (two) times daily. 08/20/23   Saunders Shona CROME, PA-C  cetirizine  (ZYRTEC  ALLERGY) 10 MG tablet Take 1 tablet (10 mg total) by mouth daily. 10/12/22   Corlis Burnard DEL, NP  fluticasone  (FLONASE ) 50 MCG/ACT nasal spray Place 2 sprays into both nostrils daily. 07/24/19   [provider]   meloxicam  (MOBIC ) 7.5 MG tablet Take 7.5 mg by mouth daily. 10/28/20   [provider]  montelukast (SINGULAIR) 10 MG tablet Take 10 mg by mouth daily. 11/18/18   [provider]  ondansetron  (ZOFRAN -ODT) 4 MG disintegrating tablet Take 1 tablet (4 mg total) by mouth every 8 (eight) hours as needed for nausea or vomiting. 08/20/23   Saunders Shona CROME, PA-C  PROAIR HFA 108 (90 Base) MCG/ACT inhaler Place 2 puffs into the nose every 6 (six) hours as needed for wheezing or shortness of breath.  06/12/17   [provider]  trimethoprim  (TRIMPEX ) 100 MG tablet Take 100 mg by mouth daily.    [provider]    Physical Exam Vitals: currently breastfeeding.  General: NAD HEENT: normocephalic, anicteric Thyroid: no enlargement, no palpable nodules Pulmonary: No increased work of breathing, CTAB Cardiovascular: RRR, distal pulses 2+ Breast: Breast symmetrical, no tenderness, no palpable nodules or masses, no skin or nipple retraction present, no nipple discharge.  No axillary or supraclavicular lymphadenopathy. Abdomen: NABS, soft, non-tender, non-distended.  Umbilicus without lesions.  No hepatomegaly, splenomegaly or masses palpable. No evidence of hernia  Genitourinary:  External: Normal external female genitalia.  Normal urethral meatus, normal Bartholin's and Skene's glands.    Vagina: Normal vaginal mucosa, no evidence of prolapse.    Cervix: Grossly normal in appearance, no bleeding  Uterus: Non-enlarged, mobile, normal contour.  No CMT  Adnexa: ovaries non-enlarged, no adnexal masses  Rectal: deferred  Lymphatic: no evidence of inguinal lymphadenopathy Extremities: no edema, erythema, or tenderness Neurologic: Grossly intact Psychiatric: mood appropriate, affect full  Female chaperone present for pelvic and breast  portions of the physical exam    Assessment: 33 y.o. G3P3003 routine annual exam  Plan: Problem List Items Addressed This Visit    None   2) STI screening  {Blank single:19197::was,was not}offered and {Blank single:19197::accepted,declined,therefore not obtained}  2)  ASCCP guidelines and rational discussed.  Patient opts for {Blank single:19197::***,every 5 years,every 3 years,yearly,discontinue age >65,discontinue secondary to prior hysterectomy} screening interval  3) Contraception - the patient is currently using  {method:5051}.  She is {Blank single:19197::happy with her current form of contraception and plans to continue,interested in changing to ***,interested in starting Contraception: ***,not currently in need of contraception secondary to being sterile,attempting to conceive in the near future}  4) Routine healthcare maintenance including cholesterol, diabetes screening discussed {Blank single:19197::managed by PCP,Ordered today,To return fasting at a later date,Declines}  5) No follow-ups on file.  Jinnie Cookey, CNM  [facility] 02/29/2024, 4:12 PM        [1] No Known Allergies  "

## 2024-03-02 ENCOUNTER — Encounter: Admitting: Licensed Practical Nurse

## 2024-03-30 ENCOUNTER — Encounter: Admitting: Certified Nurse Midwife

## 2024-03-30 DIAGNOSIS — Z01419 Encounter for gynecological examination (general) (routine) without abnormal findings: Secondary | ICD-10-CM

## 2024-03-30 NOTE — Patient Instructions (Signed)
 Preventive Care 28-35 Years Old, Female  Preventive care refers to lifestyle choices and visits with your health care provider that can promote health and wellness. Preventive care visits are also called wellness exams. What can I expect for my preventive care visit? Counseling During your preventive care visit, your health care provider may ask about your: Medical history, including: Past medical problems. Family medical history. Pregnancy history. Current health, including: Menstrual cycle. Method of birth control. Emotional well-being. Home life and relationship well-being. Sexual activity and sexual health. Lifestyle, including: Alcohol, nicotine or tobacco, and drug use. Access to firearms. Diet, exercise, and sleep habits. Work and work Astronomer. Sunscreen use. Safety issues such as seatbelt and bike helmet use. Physical exam Your health care provider may check your: Height and weight. These may be used to calculate your BMI (body mass index). BMI is a measurement that tells if you are at a healthy weight. Waist circumference. This measures the distance around your waistline. This measurement also tells if you are at a healthy weight and may help predict your risk of certain diseases, such as type 2 diabetes and high blood pressure. Heart rate and blood pressure. Body temperature. Skin for abnormal spots. What immunizations do I need?  Vaccines are usually given at various ages, according to a schedule. Your health care provider will recommend vaccines for you based on your age, medical history, and lifestyle or other factors, such as travel or where you work. What tests do I need? Screening Your health care provider may recommend screening tests for certain conditions. This may include: Pelvic exam and Pap test. Lipid and cholesterol levels. Diabetes screening. This is done by checking your blood sugar (glucose) after you have not eaten for a while  (fasting). Hepatitis B test. Hepatitis C test. HIV (human immunodeficiency virus) test. STI (sexually transmitted infection) testing, if you are at risk. BRCA-related cancer screening. This may be done if you have a family history of breast, ovarian, tubal, or peritoneal cancers. Talk with your health care provider about your test results, treatment options, and if necessary, the need for more tests. Follow these instructions at home: Eating and drinking  Eat a healthy diet that includes fresh fruits and vegetables, whole grains, lean protein, and low-fat dairy products. Take vitamin and mineral supplements as recommended by your health care provider. Do not drink alcohol if: Your health care provider tells you not to drink. You are pregnant, may be pregnant, or are planning to become pregnant. If you drink alcohol: Limit how much you have to 35-1 drink a day. Know how much alcohol is in your drink. In the [place] one drink equals one 12 oz bottle of beer (355 mL), one 5 oz glass of wine (148 mL), or one 1 oz glass of hard liquor (44 mL). Lifestyle Brush your teeth every morning and night with fluoride toothpaste. Floss one time each day. Exercise for at least 30 minutes 5 or more days each week. Do not use any products that contain nicotine or tobacco. These products include cigarettes, chewing tobacco, and vaping devices, such as e-cigarettes. If you need help quitting, ask your health care provider. Do not use drugs. If you are sexually active, practice safe sex. Use a condom or other form of protection to prevent STIs. If you do not wish to become pregnant, use a form of birth control. If you plan to become pregnant, see your health care provider for a prepregnancy visit. Find healthy ways to manage stress, such as:  Meditation, yoga, or listening to music. Journaling. Talking to a trusted person. Spending time with friends and family. Minimize exposure to UV radiation to reduce your  risk of skin cancer. Safety Always wear your seat belt while driving or riding in a vehicle. Do not drive: If you have been drinking alcohol. Do not ride with someone who has been drinking. If you have been using any mind-altering substances or drugs. While texting. When you are tired or distracted. Wear a helmet and other protective equipment during sports activities. If you have firearms in your house, make sure you follow all gun safety procedures. Seek help if you have been physically or sexually abused. What's next? Go to your health care provider once a year for an annual wellness visit. Ask your health care provider how often you should have your eyes and teeth checked. Stay up to date on all vaccines. This information is not intended to replace advice given to you by your health care provider. Make sure you discuss any questions you have with your health care provider. Document Revised: 08/15/2020 Document Reviewed: 08/15/2020 Elsevier Patient Education  2024 Elsevier Inc.     How to Do a Breast Self-Exam Doing breast self-exams can help you stay healthy. They're one way to know what's normal for your breasts. They can help you catch a problem while it's still small and can be treated. You need to: Check your breasts often. Tell your doctor about any changes. You should do breast self-exams even if you have breast implants. What you need: A mirror. A well-lit room. A pillow or other soft object. How to do a breast self-exam Look for changes  Take off all the clothes above your waist. Stand in front of a mirror in a room with good lighting. Put your hands down at your sides. Compare your breasts in the mirror. Look for difference between them, such as: Differences in shape. Differences in size. Wrinkles, dips, and bumps in one breast and not the other. Look at each breast for skin changes, such as: Redness. Scaly spots. Spots where your skin is  thicker. Dimpling. Open sores. Look for changes in your nipples, such as: Fluid coming out of a nipple. Fluid around a nipple. Bleeding. Dimpling. Redness. A nipple that looks pushed in or that has changed position. Feel for changes Lie on your back. Feel each breast. To do this: Pick a breast to feel. Place a pillow under the shoulder closest to that breast. Put the arm closest to that breast behind your head. Feel the breast using the hand of your other arm. Use the pads of your three middle fingers to make small circles starting near the nipple. Use light, medium, and firm pressure. Keep making circles, moving down over the breast. Stop when you feel your ribs. Start making circles with your fingers again, this time going up until you reach your collarbone. Then, make circles out across your breast and into your armpit area. Squeeze your nipple. Check for fluid and lumps. Do these steps again to check your other breast. Sit or stand in the tub or shower. With soapy water on your skin, feel each breast the same way you did when you were lying down. Write down what you find Writing down what you find can help you keep track of what you want to tell your doctor. Write down: What's normal for each breast. Any changes you find. Write down: The kind of change. If your breast feels tender or painful.  Any lump you find. Write down its size and where it is. When you last had your period. General tips If you're breastfeeding, the best time to check your breasts is after you feed your baby or after you use a breast pump. If you get a period, the best time to check your breasts is 5-7 days after your period ends. With time, you'll get more used to doing the self-exam. You'll also start to know if there are changes in your breasts. Contact a doctor if: You see a change in the shape or size of your breasts or nipples. You see a change in the skin of your breast or nipples. You have fluid  coming from your nipples that isn't normal. You find a new lump or thick area. You have breast pain. You have any concerns about your breast health. This information is not intended to replace advice given to you by your health care provider. Make sure you discuss any questions you have with your health care provider. Document Revised: 04/29/2023 Document Reviewed: 04/29/2023 Elsevier Patient Education  2025 [employer].

## 2024-03-30 NOTE — Progress Notes (Signed)
 Erroneous encounter, patient dnka
